# Patient Record
Sex: Female | Born: 1960 | Race: White | Hispanic: No | Marital: Married | Smoking: Never smoker
Health system: Southern US, Community
[De-identification: ages and names within clinical notes are randomized; demographics above are authoritative.]

## PROBLEM LIST (undated history)

## (undated) DIAGNOSIS — S82899A Other fracture of unspecified lower leg, initial encounter for closed fracture: Secondary | ICD-10-CM

## (undated) DIAGNOSIS — N3010 Interstitial cystitis (chronic) without hematuria: Secondary | ICD-10-CM

## (undated) DIAGNOSIS — N809 Endometriosis, unspecified: Secondary | ICD-10-CM

## (undated) DIAGNOSIS — F419 Anxiety disorder, unspecified: Secondary | ICD-10-CM

## (undated) DIAGNOSIS — F410 Panic disorder [episodic paroxysmal anxiety] without agoraphobia: Secondary | ICD-10-CM

## (undated) DIAGNOSIS — R87619 Unspecified abnormal cytological findings in specimens from cervix uteri: Secondary | ICD-10-CM

## (undated) HISTORY — DX: Unspecified abnormal cytological findings in specimens from cervix uteri: R87.619

## (undated) HISTORY — PX: OTHER SURGICAL HISTORY: SHX169

## (undated) HISTORY — DX: Interstitial cystitis (chronic) without hematuria: N30.10

## (undated) HISTORY — DX: Endometriosis, unspecified: N80.9

## (undated) HISTORY — DX: Panic disorder (episodic paroxysmal anxiety): F41.0

## (undated) HISTORY — DX: Other fracture of unspecified lower leg, initial encounter for closed fracture: S82.899A

## (undated) HISTORY — DX: Anxiety disorder, unspecified: F41.9

## (undated) HISTORY — PX: DIAGNOSTIC LAPAROSCOPY: SUR761

---

## 1981-07-03 DIAGNOSIS — N3010 Interstitial cystitis (chronic) without hematuria: Secondary | ICD-10-CM

## 1981-07-03 HISTORY — DX: Interstitial cystitis (chronic) without hematuria: N30.10

## 1999-12-01 ENCOUNTER — Other Ambulatory Visit: Admission: RE | Admit: 1999-12-01 | Discharge: 1999-12-01 | Payer: Self-pay | Admitting: Obstetrics and Gynecology

## 2000-12-27 ENCOUNTER — Other Ambulatory Visit: Admission: RE | Admit: 2000-12-27 | Discharge: 2000-12-27 | Payer: Self-pay | Admitting: Obstetrics and Gynecology

## 2002-04-02 ENCOUNTER — Other Ambulatory Visit: Admission: RE | Admit: 2002-04-02 | Discharge: 2002-04-02 | Payer: Self-pay | Admitting: Obstetrics and Gynecology

## 2002-07-03 LAB — HM DEXA SCAN

## 2003-05-19 ENCOUNTER — Other Ambulatory Visit: Admission: RE | Admit: 2003-05-19 | Discharge: 2003-05-19 | Payer: Self-pay | Admitting: Obstetrics and Gynecology

## 2004-12-19 ENCOUNTER — Other Ambulatory Visit: Admission: RE | Admit: 2004-12-19 | Discharge: 2004-12-19 | Payer: Self-pay | Admitting: Obstetrics and Gynecology

## 2006-02-07 ENCOUNTER — Other Ambulatory Visit: Admission: RE | Admit: 2006-02-07 | Discharge: 2006-02-07 | Payer: Self-pay | Admitting: Obstetrics and Gynecology

## 2007-04-08 ENCOUNTER — Other Ambulatory Visit: Admission: RE | Admit: 2007-04-08 | Discharge: 2007-04-08 | Payer: Self-pay | Admitting: Obstetrics and Gynecology

## 2008-06-04 ENCOUNTER — Other Ambulatory Visit: Admission: RE | Admit: 2008-06-04 | Discharge: 2008-06-04 | Payer: Self-pay | Admitting: Obstetrics & Gynecology

## 2008-06-24 ENCOUNTER — Inpatient Hospital Stay (HOSPITAL_COMMUNITY): Admission: EM | Admit: 2008-06-24 | Discharge: 2008-06-25 | Payer: Self-pay | Admitting: Emergency Medicine

## 2008-06-24 DIAGNOSIS — S82899A Other fracture of unspecified lower leg, initial encounter for closed fracture: Secondary | ICD-10-CM

## 2008-06-24 HISTORY — DX: Other fracture of unspecified lower leg, initial encounter for closed fracture: S82.899A

## 2010-11-15 NOTE — Op Note (Signed)
Janet Salinas, Janet Salinas                ACCOUNT NO.:  [id number]   MEDICAL RECORD NO.:  [id number]          PATIENT TYPE:  INP   LOCATION:  1530                         FACILITY:  [facility]   PHYSICIAN:  Nadara Mustard, MD     DATE OF BIRTH:  1961/03/26   DATE OF PROCEDURE:  06/24/2008  DATE OF DISCHARGE:                               OPERATIVE REPORT   PREOPERATIVE DIAGNOSIS:  Displaced Weber B left fibular fracture with  widening of the mortise.   POSTOPERATIVE DIAGNOSIS:  Displaced Weber B left fibular fracture with  widening of the mortise.   PROCEDURE:  1. Open reduction and internal fixation, left fibula.  2. Syndesmotic screw to stabilize the syndesmosis.   SURGEON:  Nadara Mustard, MD   ANESTHESIA:  General.   ESTIMATED BLOOD LOSS:  Minimal.   ANTIBIOTICS:  Kefzol 1 g.   DRAINS:  None.   COMPLICATIONS:  None.   TOURNIQUET TIME:  None.   DISPOSITION:  To PACU in stable condition.   INDICATIONS FOR PROCEDURE:  The patient is a 50 year old woman who  slipped on the ice today sustaining a displaced left ankle Weber B  fracture with fracture of the fibula and displacement of the  syndesmosis.  The patient presents at this time for open reduction and  internal fixation due to instability of the ankle.  Risks and benefits  were discussed with the patient including infection, neurovascular  injury, persistent pain, recurrent widening of the mortise, arthritis,  need for additional surgery.  The patient states he understands and  wished to proceed at this time.   PROCEDURE:  The patient was brought to OR and underwent a general  anesthetic.  After adequate level of anesthesia obtained, the patient's  left lower extremity was prepped using DuraPrep and draped into a  sterile field.  A lateral incision was made.  This was carried sharply  down to the fibula.  The fracture site was freshened.  The fracture was  reduced and then stabilized with an interfrag screw.  A  neutralization  locking one-third tubular plate was applied laterally with seven holes.  This was locked proximally with three locking screws and locked distally  with two locking screws.  The C-arm fluoroscopy was brought in.  There  was still widening of the mortise.  The mortise was then reduced and  this was then stabilized with a syndesmotic screw, 3.5 cortical screw 45  mm in length, 3 cortical screw.  C-arm fluoroscopy verified reduction on  both AP and lateral planes with restoration of the mortise.  The wound  was irrigated with normal saline.  Subcu was closed using 2-0 Vicryl.  The skin was closed using  approximated staples.  The wound was covered with Adaptic orthopedic  sponges, ABD, Kerlix, and Coban.  The patient was then extubated, taken  to PACU in stable condition.  Plan for 23-hour observation.  Discharge  in the morning.  Follow up in office in 2 weeks.      Nadara Mustard, MD  Electronically Signed     MVD/MEDQ  D:  06/24/2008  T:  06/25/2008  Job:  [id number]

## 2011-04-07 LAB — DIFFERENTIAL
Basophils Absolute: 0.4 10*3/uL — ABNORMAL HIGH (ref 0.0–0.1)
Basophils Relative: 3 % — ABNORMAL HIGH (ref 0–1)
Eosinophils Absolute: 0 10*3/uL (ref 0.0–0.7)
Eosinophils Relative: 0 % (ref 0–5)
Lymphocytes Relative: 6 % — ABNORMAL LOW (ref 12–46)
Lymphs Abs: 0.8 10*3/uL (ref 0.7–4.0)
Monocytes Absolute: 0.6 10*3/uL (ref 0.1–1.0)
Monocytes Relative: 4 % (ref 3–12)
Neutro Abs: 12.5 10*3/uL — ABNORMAL HIGH (ref 1.7–7.7)
Neutrophils Relative %: 88 % — ABNORMAL HIGH (ref 43–77)

## 2011-04-07 LAB — COMPREHENSIVE METABOLIC PANEL
ALT: 18 U/L (ref 0–35)
AST: 20 U/L (ref 0–37)
Albumin: 4.3 g/dL (ref 3.5–5.2)
Alkaline Phosphatase: 55 U/L (ref 39–117)
BUN: 10 mg/dL (ref 6–23)
CO2: 24 mEq/L (ref 19–32)
Calcium: 9.4 mg/dL (ref 8.4–10.5)
Chloride: 108 mEq/L (ref 96–112)
Creatinine, Ser: 0.73 mg/dL (ref 0.4–1.2)
GFR calc Af Amer: >60 mL/min (ref >60)
GFR calc non Af Amer: >60 mL/min (ref >60)
Glucose, Bld: 113 mg/dL — ABNORMAL HIGH (ref 70–99)
Potassium: 4.8 mEq/L (ref 3.5–5.1)
Sodium: 139 mEq/L (ref 135–145)
Total Bilirubin: 0.9 mg/dL (ref 0.3–1.2)
Total Protein: 7.1 g/dL (ref 6.0–8.3)

## 2011-04-07 LAB — CBC
HCT: 44.3 % (ref 36.0–46.0)
Hemoglobin: 15.1 g/dL — ABNORMAL HIGH (ref 12.0–15.0)
MCHC: 34.2 g/dL (ref 30.0–36.0)
MCV: 88.2 fL (ref 78.0–100.0)
Platelets: 196 10*3/uL (ref 150–400)
RBC: 5.02 MIL/uL (ref 3.87–5.11)
RDW: 11.9 % (ref 11.5–15.5)
WBC: 14.3 10*3/uL — ABNORMAL HIGH (ref 4.0–10.5)

## 2011-10-31 DIAGNOSIS — R87619 Unspecified abnormal cytological findings in specimens from cervix uteri: Secondary | ICD-10-CM

## 2011-10-31 HISTORY — DX: Unspecified abnormal cytological findings in specimens from cervix uteri: R87.619

## 2012-11-21 ENCOUNTER — Other Ambulatory Visit: Payer: Self-pay | Admitting: Nurse Practitioner

## 2012-11-22 NOTE — Telephone Encounter (Signed)
Please advise- Pt's last aex was 10/31/2011. LVM for pt to return my call to schedule aex. Ok to refill depo injection (104 mg)?  Chart in your rack.

## 2012-11-26 NOTE — Telephone Encounter (Signed)
OK to refill Depo Provera

## 2012-11-27 NOTE — Telephone Encounter (Signed)
RX x 1 sent to pharmacy.

## 2012-12-02 ENCOUNTER — Other Ambulatory Visit: Payer: Self-pay

## 2012-12-02 ENCOUNTER — Ambulatory Visit (INDEPENDENT_AMBULATORY_CARE_PROVIDER_SITE_OTHER): Payer: Self-pay | Admitting: *Deleted

## 2012-12-02 ENCOUNTER — Ambulatory Visit: Payer: Self-pay

## 2012-12-02 VITALS — BP 120/76 | Wt 162.0 lb

## 2012-12-02 DIAGNOSIS — Z3040 Encounter for surveillance of contraceptives, unspecified: Secondary | ICD-10-CM

## 2012-12-02 MED ORDER — MEDROXYPROGESTERONE ACETATE 104 MG/0.65ML ~~LOC~~ SUSP
104.0000 mg | Freq: Once | SUBCUTANEOUS | Status: AC
  Administered 2012-12-02: 104 mg via SUBCUTANEOUS

## 2012-12-02 NOTE — Progress Notes (Signed)
Date last pap: 10/31/11. Last Depo-Provera: 09/16/12. Side Effects if any: none. Serum HCG indicated? no. Depo-Provera 104 mg SQ given by: Francee Piccolo, CMA (AAMA). Next appointment due 8/18-9/1. Pt also advised she is due to AEX and no further injections can be given without AEX.  Pt voices understanding. Pt supplied medication

## 2013-02-06 ENCOUNTER — Other Ambulatory Visit: Payer: Self-pay | Admitting: Nurse Practitioner

## 2013-02-06 NOTE — Telephone Encounter (Signed)
eScribe request for refill on DEPO PROVERA 104 Next AEX - 02/17/13 and Depo injection One injection called to pharmacy.

## 2013-02-13 ENCOUNTER — Encounter: Payer: Self-pay | Admitting: *Deleted

## 2013-02-14 ENCOUNTER — Ambulatory Visit (INDEPENDENT_AMBULATORY_CARE_PROVIDER_SITE_OTHER): Payer: Self-pay | Admitting: Nurse Practitioner

## 2013-02-14 ENCOUNTER — Encounter: Payer: Self-pay | Admitting: Nurse Practitioner

## 2013-02-14 VITALS — BP 118/64 | HR 72 | Resp 12 | Ht 63.0 in | Wt 159.6 lb

## 2013-02-14 DIAGNOSIS — Z3040 Encounter for surveillance of contraceptives, unspecified: Secondary | ICD-10-CM

## 2013-02-14 DIAGNOSIS — Z01419 Encounter for gynecological examination (general) (routine) without abnormal findings: Secondary | ICD-10-CM

## 2013-02-14 DIAGNOSIS — Z1211 Encounter for screening for malignant neoplasm of colon: Secondary | ICD-10-CM

## 2013-02-14 DIAGNOSIS — Z0000 Encounter for general adult medical examination without abnormal findings: Secondary | ICD-10-CM

## 2013-02-14 LAB — HEMOGLOBIN, FINGERSTICK: Hemoglobin, fingerstick: 14.5 g/dL (ref 12.0–16.0)

## 2013-02-14 MED ORDER — MEDROXYPROGESTERONE ACETATE 104 MG/0.65ML ~~LOC~~ SUSP
104.0000 mg | Freq: Once | SUBCUTANEOUS | Status: AC
  Administered 2013-02-14: 104 mg via SUBCUTANEOUS

## 2013-02-14 MED ORDER — MEDROXYPROGESTERONE ACETATE 150 MG/ML IM SUSP: 150.0000 mg | Freq: Once | INTRAMUSCULAR | Status: DC

## 2013-02-14 NOTE — Patient Instructions (Signed)

## 2013-02-14 NOTE — Progress Notes (Signed)
52 y.o. G2P2 Married Caucasian Fe here for annual exam.  No new health problems except some recent mucous and lower pelvic pain with BM. At first she thought was related to time of next Depo and endometriosis pain. Does not seem to be related to change in diet.  No LMP recorded. Patient has had an injection.          Sexually active: yes  The current method of family planning is Depo-Provera injections.    Exercising: yes  weights  Smoker:  no  Health Maintenance: Pap:  10/31/2011  ASCUS with Negative HR HPV  MMG:  11/21/2011 will schedule Colonoscopy:  never BMD:   2004  -0.8/-0.6 TDaP:  Pt unsure  Labs: Hgb- 14.5    reports that she has never smoked. She has never used smokeless tobacco. She reports that she does not drink alcohol or use illicit drugs.  Past Medical History  Diagnosis Date  . Endometriosis   . Anxiety   . Panic attacks   . IC (interstitial cystitis)     Past Surgical History  Procedure Laterality Date  . Diagnostic laparoscopy    . Adenomyoses      ovaries adherdt to sidewall   . Vaginal delivery      x2    Current Outpatient Prescriptions  Medication Sig Dispense Refill  . Ascorbic Acid (VITAMIN C) 100 MG tablet Take 100 mg by mouth daily.      Marland Kitchen aspirin 81 MG tablet Take 81 mg by mouth daily.      . calcium carbonate (OS-CAL) 600 MG TABS tablet Take 600 mg by mouth 2 (two) times daily with a meal.      . DEPO-SUBQ PROVERA 104 104 MG/0.65ML injection INJECT SUBCUTANEOUS EVERY 3 MONTHS AS DIRECTED  0.65 mL  0  . fish oil-omega-3 fatty acids 1000 MG capsule Take 2 g by mouth daily.       No current facility-administered medications for this visit.    Family History  Problem Relation Age of Onset  . Hypertension Father   . Infertility Sister     ROS:  Pertinent items are noted in HPI.  Otherwise, a comprehensive ROS was negative.  Exam:   BP 118/64  Pulse 72  Resp 12  Ht 5\' 3"  (1.6 m)  Wt 159 lb 9.6 oz (72.394 kg)  BMI 28.28 kg/m2 Height: 5'  3" (160 cm)  Ht Readings from Last 3 Encounters:  02/14/13 5\' 3"  (1.6 m)    General appearance: alert, cooperative and appears stated age Head: Normocephalic, without obvious abnormality, atraumatic Neck: no adenopathy, supple, symmetrical, trachea midline and thyroid normal to inspection and palpation Lungs: clear to auscultation bilaterally Breasts: normal appearance, no masses or tenderness Heart: regular rate and rhythm Abdomen: soft, non-tender; no masses,  no organomegaly Extremities: extremities normal, atraumatic, no cyanosis or edema Skin: Skin color, texture, turgor normal. No rashes or lesions. Few hives on legs. Lymph nodes: Cervical, supraclavicular, and axillary nodes normal. No abnormal inguinal nodes palpated Neurologic: Grossly normal   Pelvic: External genitalia:  no lesions              Urethra:  normal appearing urethra with no masses, tenderness or lesions              Bartholin's and Skene's: normal                 Vagina: normal appearing vagina with normal color and discharge, no lesions  Cervix: anteverted              Pap taken: no Bimanual Exam:  Uterus:  normal size, contour, position, consistency, mobility, non-tender              Adnexa: no mass, fullness, tenderness               Rectovaginal: Confirms               Anus:  normal sphincter tone, no lesions  A:  Well Woman with normal exam  Depo Provera for contraception and severe endometriosis  Recent bowel changes - will get screening colonoscopy  History of idiopathic hives.  P:   Pap smear as per guidelines   Mammogram will schedule  Will check TSH and FSH today  Next Depo due today and will go ahead with injection - unsure if she  will need further injections - but do not want her to start with increase pelvic pain if she were to start having menses again.  Counseled on breast self exam, adequate intake of calcium and vitamin D, diet and exercise return annually or prn  An  After Visit Summary was printed and given to the patient.

## 2013-02-15 LAB — FOLLICLE STIMULATING HORMONE: FSH: 6.4 m[IU]/mL

## 2013-02-15 LAB — TSH: TSH: 2.357 u[IU]/mL (ref 0.350–4.500)

## 2013-02-17 ENCOUNTER — Telehealth: Payer: Self-pay | Admitting: *Deleted

## 2013-02-17 ENCOUNTER — Ambulatory Visit: Payer: Self-pay | Admitting: Nurse Practitioner

## 2013-02-17 NOTE — Progress Notes (Signed)
Encounter reviewed by Dr. Brook Silva.  

## 2013-02-17 NOTE — Telephone Encounter (Signed)
LVM for pt to return my call in regards to lab results.  

## 2013-02-17 NOTE — Telephone Encounter (Signed)
Message copied by Osie Bond on Mon Feb 17, 2013 11:21 AM ------      Message from: Ria Comment R      Created: Mon Feb 17, 2013  8:51 AM       Let patient know that she is not in menopause and does need to continue with Depo Provera.  Her TSH is also normal. We have sent referral for GI for her. ------

## 2013-02-21 ENCOUNTER — Telehealth: Payer: Self-pay | Admitting: Orthopedic Surgery

## 2013-02-21 NOTE — Telephone Encounter (Signed)
Spoke with pt's husband, as this is his cell number. He gave me pt's cell number [phone].

## 2013-02-21 NOTE — Telephone Encounter (Signed)
Call to home number. Mailbox is full and cannot leave message.

## 2013-02-21 NOTE — Telephone Encounter (Signed)
Call to pt's cell. Mailbox is full and cannot leave message.   (Need to tell her about appt with Dr. Loreta Ave 02-27-13 at 2:30. Phone [phone] to reschedule. Address [address])

## 2013-05-06 ENCOUNTER — Other Ambulatory Visit: Payer: Self-pay | Admitting: Nurse Practitioner

## 2013-05-14 ENCOUNTER — Encounter: Payer: Self-pay | Admitting: Nurse Practitioner

## 2013-05-21 ENCOUNTER — Ambulatory Visit: Payer: Self-pay

## 2013-05-21 ENCOUNTER — Ambulatory Visit (INDEPENDENT_AMBULATORY_CARE_PROVIDER_SITE_OTHER): Payer: Self-pay | Admitting: Obstetrics and Gynecology

## 2013-05-21 VITALS — BP 124/82 | HR 72 | Ht 63.0 in | Wt 161.0 lb

## 2013-05-21 DIAGNOSIS — Z3040 Encounter for surveillance of contraceptives, unspecified: Secondary | ICD-10-CM

## 2013-05-21 MED ORDER — MEDROXYPROGESTERONE ACETATE 104 MG/0.65ML ~~LOC~~ SUSP
104.0000 mg | Freq: Once | SUBCUTANEOUS | Status: AC
  Administered 2013-05-21: 104 mg via SUBCUTANEOUS

## 2013-05-21 NOTE — Progress Notes (Signed)
Patient ID: Janet Salinas, female   DOB: Jan 13, 1961, 52 y.o.   MRN: [id number] Pt arrived for Depo Provera injection.   Last AEX - 02/14/13 Last Depo Provera Given - 02/14/13 Pt is 5 days past due dates.  Pt's appt was scheduled past her due dates by our office.  Pt is very upset that we would schedule her appt wrong.  I apologized to the patient for our error. Pt adamantly refuses to do UPT stating that she knows she is not pregnant.  Pt states she has not had intercourse in at least one month.  Advised pt that I would have to consult with a physician before I could give her the injection. Pt is upset that she has had to wait once she got here, that we have made her wait to get her injection, and that someone different is giving her her injection when she has asked [place] for the same person.  Advised pt that Tiffany, who usually gives her Depo, is no longer here. Injection was given SQ in left anterior thigh without difficulty.  Pt is due to return for next injection between 08/06/13 and 08/20/13.  Reviewed these dates with the patient and requested she make sure when her appt is made today that it is between these two dates. I also advised the patient that the nursing supervisor will be meeting with the clerical supervisor to make to assure this does not happen again.  Pt feels better that steps will be taken to prevent this from happening again.

## 2013-05-21 NOTE — Progress Notes (Signed)
Encounter reviewed by Dr. Cyril Railey Silva.  

## 2013-06-25 ENCOUNTER — Encounter: Payer: Self-pay | Admitting: Nurse Practitioner

## 2013-07-26 ENCOUNTER — Other Ambulatory Visit: Payer: Self-pay | Admitting: Certified Nurse Midwife

## 2013-08-06 ENCOUNTER — Ambulatory Visit (INDEPENDENT_AMBULATORY_CARE_PROVIDER_SITE_OTHER): Payer: BC Managed Care – PPO | Admitting: *Deleted

## 2013-08-06 VITALS — BP 138/80 | HR 82 | Resp 16 | Wt 163.0 lb

## 2013-08-06 DIAGNOSIS — Z3040 Encounter for surveillance of contraceptives, unspecified: Secondary | ICD-10-CM

## 2013-08-06 MED ORDER — MEDROXYPROGESTERONE ACETATE 104 MG/0.65ML ~~LOC~~ SUSP
104.0000 mg | Freq: Once | SUBCUTANEOUS | Status: AC
  Administered 2013-08-06: 104 mg via SUBCUTANEOUS

## 2013-08-06 NOTE — Progress Notes (Signed)
Depo Provera 104mg  Sub Q given  Rt. Anterior thigh. Pt tolerated injection well. Next Depo Provera Due 4/22-11/05/13 cm

## 2013-10-17 ENCOUNTER — Other Ambulatory Visit: Payer: Self-pay | Admitting: Nurse Practitioner

## 2013-10-17 NOTE — Telephone Encounter (Signed)
Last AEX 02/14/13 Last refill 07/26/13 Next appt 03/17/14  Please approve or deny Rx.

## 2013-10-20 ENCOUNTER — Telehealth: Payer: Self-pay | Admitting: Nurse Practitioner

## 2013-10-20 NOTE — Telephone Encounter (Signed)
Confirmed with patient.

## 2013-10-20 NOTE — Telephone Encounter (Signed)
Calling patient back from phone message about appt

## 2013-10-23 ENCOUNTER — Ambulatory Visit (INDEPENDENT_AMBULATORY_CARE_PROVIDER_SITE_OTHER): Payer: BC Managed Care – PPO

## 2013-10-23 VITALS — BP 108/68 | HR 68 | Resp 16 | Ht 63.0 in | Wt 158.0 lb

## 2013-10-23 DIAGNOSIS — Z3040 Encounter for surveillance of contraceptives, unspecified: Secondary | ICD-10-CM

## 2013-10-23 MED ORDER — MEDROXYPROGESTERONE ACETATE 104 MG/0.65ML ~~LOC~~ SUSP
104.0000 mg | Freq: Once | SUBCUTANEOUS | Status: AC
  Administered 2013-10-23: 104 mg via SUBCUTANEOUS

## 2013-10-23 NOTE — Progress Notes (Signed)
Patient is here for depo 104 sub q. Last aex was 02-14-13. Last depo given was 08-06-13. Next depo due between 01-08-14 thru 01-22-14.pt tolerated injection well

## 2014-01-01 ENCOUNTER — Other Ambulatory Visit: Payer: Self-pay | Admitting: Certified Nurse Midwife

## 2014-01-05 NOTE — Telephone Encounter (Signed)
Last refilled: 10/19/13 Last AEX: 02/14/13  AEX scheduled for 03/17/14 with Ms. Patty Next Depo Scheduled for 01/08/14 @ 2:00  Okay to refill?

## 2014-01-08 ENCOUNTER — Ambulatory Visit (INDEPENDENT_AMBULATORY_CARE_PROVIDER_SITE_OTHER): Payer: BC Managed Care – PPO | Admitting: Obstetrics and Gynecology

## 2014-01-08 VITALS — BP 128/82 | HR 72 | Ht 63.0 in | Wt 162.0 lb

## 2014-01-08 DIAGNOSIS — Z3040 Encounter for surveillance of contraceptives, unspecified: Secondary | ICD-10-CM

## 2014-01-08 MED ORDER — MEDROXYPROGESTERONE ACETATE 104 MG/0.65ML ~~LOC~~ SUSP: 104.0000 mg | Freq: Once | SUBCUTANEOUS | Status: DC

## 2014-01-08 MED ORDER — MEDROXYPROGESTERONE ACETATE 104 MG/0.65ML ~~LOC~~ SUSP
104.0000 mg | Freq: Once | SUBCUTANEOUS | Status: AC
  Administered 2014-01-08: 104 mg via SUBCUTANEOUS

## 2014-01-08 NOTE — Progress Notes (Signed)
Encounter reviewed by Dr. Brook Silva.  

## 2014-01-08 NOTE — Progress Notes (Signed)
Janet FlavinJean M Salinas is a 53 y.o. female Who presents for SQ Depo Provera Injection.  ID and allergies confirmed. Denies complaints today.  Patient supplies her own injection today of DEPO-SUBQ PROVERA 104. 104 MG/0.65ML injection.  Injection given in mid L anterior thigh. Tolerated well and denies complaints after injection. Will return for next injection between 9/24 and 04/09/14.   Lot [id number] Expires 05/2016. Prefilled syringe. No NDC code on box.

## 2014-01-08 NOTE — Patient Instructions (Signed)
Return for next injection between 03/26/14 and 04/09/14.

## 2014-01-12 NOTE — Progress Notes (Signed)
I would not change anything about her Depo until her FSH is clearly in menopausal range.  Definitely check it in August.  Last one was 6.4.  Once clearly menopausal, then can stop.  I'm not sure she will feel any difference being off of it.  She will have the typical lack of estrogen symptoms--hot flashes, night sweats, etc.

## 2014-02-18 ENCOUNTER — Ambulatory Visit: Payer: Self-pay | Admitting: Nurse Practitioner

## 2014-02-20 ENCOUNTER — Ambulatory Visit: Payer: Self-pay | Admitting: Nurse Practitioner

## 2014-03-17 ENCOUNTER — Ambulatory Visit: Payer: Self-pay | Admitting: Nurse Practitioner

## 2014-03-21 ENCOUNTER — Other Ambulatory Visit: Payer: Self-pay | Admitting: Nurse Practitioner

## 2014-03-23 NOTE — Telephone Encounter (Signed)
OK to give RX and will refill this for a year at AEX.

## 2014-03-23 NOTE — Telephone Encounter (Signed)
Last AEX: 02/14/13 Last refill:01/05/14 0.65 mL X 0 Current AEX:04/28/14  Please advise

## 2014-03-24 NOTE — Telephone Encounter (Signed)
Refill sent.

## 2014-03-26 ENCOUNTER — Ambulatory Visit: Payer: BC Managed Care – PPO

## 2014-03-31 ENCOUNTER — Ambulatory Visit (INDEPENDENT_AMBULATORY_CARE_PROVIDER_SITE_OTHER): Payer: BC Managed Care – PPO

## 2014-03-31 VITALS — BP 128/82 | HR 72 | Ht 63.0 in | Wt 158.0 lb

## 2014-03-31 DIAGNOSIS — Z3040 Encounter for surveillance of contraceptives, unspecified: Secondary | ICD-10-CM

## 2014-03-31 MED ORDER — MEDROXYPROGESTERONE ACETATE 104 MG/0.65ML ~~LOC~~ SUSP
104.0000 mg | Freq: Once | SUBCUTANEOUS | Status: AC
  Administered 2014-03-31: 104 mg via SUBCUTANEOUS

## 2014-03-31 NOTE — Progress Notes (Signed)
Pt here for DEPO-SUBQ PROVERA 104. 104 MG/0.65ML injection. Last AEX 02/14/13. Last Depo given was 01/08/14. Next Depo due between 12/15-12/28. Next AEX is 04/28/14.  Per Kennon RoundsSally, ok to give injection but AEX will need to be done before next injection. Pt voiced understanding and agreed.

## 2014-03-31 NOTE — Progress Notes (Signed)
Wilma FlavinJean M Hertzberg is a 53 y.o. female  Who presents for SQ Depo Provera Injection.  ID and allergies confirmed. Denies complaints today.  Patient supplies her own injection today of DEPO-SUBQ PROVERA 104. 104 MG/0.65ML injection.  Injection given in mid R anterior thigh. Tolerated well and denies complaints after injection. Will return for next injection between 06/16/14-06/29/14.  Lot [id number]  Expires 08/2016. Prefilled syringe.  No NDC code on box.

## 2014-04-28 ENCOUNTER — Ambulatory Visit: Payer: Self-pay | Admitting: Nurse Practitioner

## 2014-06-01 ENCOUNTER — Other Ambulatory Visit: Payer: Self-pay | Admitting: Nurse Practitioner

## 2014-06-01 NOTE — Telephone Encounter (Signed)
Last refilled: 03/24/14 #0.65 ml/0 rfs AEX Scheduled: 06/16/14 with Ms. Patty  Last Mammogram: 02/27/14 Bi-Rads 0; Right Breast Ultrasound Benign  Patient was supposed to have AEX done before next Depo, patient is scheduled for 06/16/14 with Ms. Patty okay to refill until then?  Please Advise.

## 2014-06-16 ENCOUNTER — Ambulatory Visit: Payer: BC Managed Care – PPO

## 2014-06-16 ENCOUNTER — Ambulatory Visit (INDEPENDENT_AMBULATORY_CARE_PROVIDER_SITE_OTHER): Payer: BC Managed Care – PPO | Admitting: Nurse Practitioner

## 2014-06-16 ENCOUNTER — Encounter: Payer: Self-pay | Admitting: Nurse Practitioner

## 2014-06-16 VITALS — BP 116/80 | HR 76 | Ht 63.25 in | Wt 162.0 lb

## 2014-06-16 DIAGNOSIS — Z1211 Encounter for screening for malignant neoplasm of colon: Secondary | ICD-10-CM

## 2014-06-16 DIAGNOSIS — Z01419 Encounter for gynecological examination (general) (routine) without abnormal findings: Secondary | ICD-10-CM

## 2014-06-16 DIAGNOSIS — Z3040 Encounter for surveillance of contraceptives, unspecified: Secondary | ICD-10-CM

## 2014-06-16 DIAGNOSIS — Z0000 Encounter for general adult medical examination without abnormal findings: Secondary | ICD-10-CM

## 2014-06-16 DIAGNOSIS — R319 Hematuria, unspecified: Secondary | ICD-10-CM

## 2014-06-16 DIAGNOSIS — Z3042 Encounter for surveillance of injectable contraceptive: Secondary | ICD-10-CM

## 2014-06-16 LAB — HEMOGLOBIN, FINGERSTICK: Hemoglobin, fingerstick: 14.1 g/dL (ref 12.0–16.0)

## 2014-06-16 LAB — LIPID PANEL
Cholesterol: 178 mg/dL (ref 0–200)
HDL: 53 mg/dL (ref >39)
LDL Cholesterol: 106 mg/dL — ABNORMAL HIGH (ref 0–99)
Total CHOL/HDL Ratio: 3.4 Ratio
Triglycerides: 97 mg/dL (ref <150)
VLDL: 19 mg/dL (ref 0–40)

## 2014-06-16 LAB — POCT URINALYSIS DIPSTICK
Bilirubin, UA: NEGATIVE
Glucose, UA: NEGATIVE
Ketones, UA: NEGATIVE
Leukocytes, UA: NEGATIVE
Nitrite, UA: NEGATIVE
Urobilinogen, UA: NEGATIVE
pH, UA: 5

## 2014-06-16 LAB — COMPREHENSIVE METABOLIC PANEL
ALT: 17 U/L (ref 0–35)
AST: 16 U/L (ref 0–37)
Albumin: 4.3 g/dL (ref 3.5–5.2)
Alkaline Phosphatase: 57 U/L (ref 39–117)
BUN: 13 mg/dL (ref 6–23)
CO2: 26 mEq/L (ref 19–32)
Calcium: 9.2 mg/dL (ref 8.4–10.5)
Chloride: 105 mEq/L (ref 96–112)
Creat: 0.75 mg/dL (ref 0.50–1.10)
Glucose, Bld: 87 mg/dL (ref 70–99)
Potassium: 4.4 mEq/L (ref 3.5–5.3)
Sodium: 141 mEq/L (ref 135–145)
Total Bilirubin: 0.3 mg/dL (ref 0.2–1.2)
Total Protein: 6.6 g/dL (ref 6.0–8.3)

## 2014-06-16 MED ORDER — MEDROXYPROGESTERONE ACETATE 104 MG/0.65ML ~~LOC~~ SUSP: SUBCUTANEOUS | Status: DC

## 2014-06-16 MED ORDER — MEDROXYPROGESTERONE ACETATE 104 MG/0.65ML ~~LOC~~ SUSP
104.0000 mg | Freq: Once | SUBCUTANEOUS | Status: AC
  Administered 2014-06-16: 104 mg via SUBCUTANEOUS

## 2014-06-16 NOTE — Progress Notes (Signed)
Patient ID: Janet FlavinJean M Coull, female   DOB: 08/19/1960, 53 y.o.   MRN: [id number] 53 y.o. G2P2 Married Caucasian Fe here for annual exam.  Last time of spotting before Depo Injection was about 1.5 years ago.  Patient's last menstrual period was 06/16/2014.          Sexually active: yes  The current method of family planning is Depo-Provera injections.  Exercising: yes weights and cardio Smoker: no  Health Maintenance: Pap: 10/31/2011 (ASCUS with Negative HR HPV 10/31/2011)  MMG: 02/27/14, right cyst noted, yearly follow up Colonoscopy: never BMD: 2004 -0.8/-0.6 TDaP: Pt unsure  Labs:  HB:  14.1  Urine:  trace RBC and protein   reports that she has never smoked. She has never used smokeless tobacco. She reports that she does not drink alcohol or use illicit drugs.  Past Medical History  Diagnosis Date  . Endometriosis   . Anxiety   . Panic attacks   . IC (interstitial cystitis) 1983  . Abnormal Pap smear of cervix 10/30/13    ASCUS with negative HR HPV    Past Surgical History  Procedure Laterality Date  . Diagnostic laparoscopy  2001, 1992    endometriosis  . Adenomyoses      ovaries adherdt to sidewall   . Vaginal delivery      x2    Current Outpatient Prescriptions  Medication Sig Dispense Refill  . Ascorbic Acid (VITAMIN C) 100 MG tablet Take 100 mg by mouth daily.    Marland Kitchen. aspirin 81 MG tablet Take 81 mg by mouth daily.    . calcium carbonate (OS-CAL) 600 MG TABS tablet Take 600 mg by mouth 2 (two) times daily with a meal.    . Cholecalciferol (VITAMIN D PO) Take by mouth daily.    . fish oil-omega-3 fatty acids 1000 MG capsule Take 2 g by mouth daily.    . medroxyPROGESTERone (DEPO-SUBQ PROVERA 104) 104 MG/0.65ML injection INJECT INTO THE SKIN EVERY 3 MONTHS AS DIRECTED 0.65 mL 3  . Multiple Vitamins-Minerals (MULTIVITAMIN PO) Take by mouth daily.     No current facility-administered medications for this visit.    Family History  Problem Relation Age of  Onset  . Hypertension Father   . Cancer Father   . Infertility Sister   . Hypertension Mother   . Hypertension Brother   . Hypertension Sister     ROS:  Pertinent items are noted in HPI.  Otherwise, a comprehensive ROS was negative.  Exam:   BP 116/80 mmHg  Pulse 76  Ht 5' 3.25" (1.607 m)  Wt 162 lb (73.483 kg)  BMI 28.45 kg/m2  LMP 06/16/2014 Height: 5' 3.25" (160.7 cm)  Ht Readings from Last 3 Encounters:  06/16/14 5' 3.25" (1.607 m)  03/31/14 5\' 3"  (1.6 m)  01/08/14 5\' 3"  (1.6 m)    General appearance: alert, cooperative and appears stated age Head: Normocephalic, without obvious abnormality, atraumatic Neck: no adenopathy, supple, symmetrical, trachea midline and thyroid normal to inspection and palpation Lungs: clear to auscultation bilaterally Breasts: normal appearance, no masses or tenderness Heart: regular rate and rhythm Abdomen: soft, non-tender; no masses,  no organomegaly Extremities: extremities normal, atraumatic, no cyanosis or edema Skin: Skin color, texture, turgor normal. No rashes or lesions Lymph nodes: Cervical, supraclavicular, and axillary nodes normal. No abnormal inguinal nodes palpated Neurologic: Grossly normal   Pelvic: External genitalia:  no lesions              Urethra:  normal appearing urethra  with no masses, tenderness or lesions              Bartholin's and Skene's: normal                 Vagina: normal appearing vagina with normal color and discharge, no lesions              Cervix: anteverted              Pap taken: Yes.   Bimanual Exam:  Uterus:  normal size, contour, position, consistency, mobility, non-tender              Adnexa: no mass, fullness, tenderness               Rectovaginal: Confirms               Anus:  normal sphincter tone, no lesions  A:  Well Woman with normal exam  Depo Provera for contraception and severe endometriosis  Will get screening colonoscopy   P:   Reviewed health and  wellness pertinent to exam  Pap smear taken today  Mammogram is due 02/2015  Depo Provera today  Counseled on breast self exam, mammography screening, adequate intake of calcium and vitamin D, diet and exercise return annually or prn  An After Visit Summary was printed and given to the patient.

## 2014-06-16 NOTE — Patient Instructions (Signed)

## 2014-06-17 LAB — URINALYSIS, MICROSCOPIC ONLY
Bacteria, UA: NONE SEEN
Casts: NONE SEEN
Crystals: NONE SEEN
Squamous Epithelial / LPF: NONE SEEN

## 2014-06-17 LAB — TSH: TSH: 2.196 u[IU]/mL (ref 0.350–4.500)

## 2014-06-17 LAB — VITAMIN D 25 HYDROXY (VIT D DEFICIENCY, FRACTURES): Vit D, 25-Hydroxy: 46 ng/mL (ref 30–100)

## 2014-06-18 ENCOUNTER — Telehealth: Payer: Self-pay | Admitting: *Deleted

## 2014-06-18 LAB — URINE CULTURE: Colony Count: 60000

## 2014-06-18 NOTE — Telephone Encounter (Signed)
I have attempted to contact this patient by phone with the following results: left message to return call to [place] at [phone] on answering machine (mobile per [facility]).  No personal information given.  [phone] (Mobile)

## 2014-06-18 NOTE — Telephone Encounter (Signed)
-----   Message from Lauro FranklinPatricia Rolen-Grubb, FNP sent at 06/18/2014  8:53 AM EST ----- Let patient know that lipid panel shows a normal total cholesterol, triglycerides and HDL.  The LDL was slight higher that normal.  The TSH, kidney , liver, and glucose was normal.  Vit D was excellent.  Urine did not show RBC as we saw here..Marland Kitchen

## 2014-06-19 ENCOUNTER — Encounter: Payer: Self-pay | Admitting: Nurse Practitioner

## 2014-06-19 LAB — IPS PAP TEST WITH HPV

## 2014-06-19 NOTE — Telephone Encounter (Signed)
Pt notified in result note 06/18/14.  Closing encounter.

## 2014-06-21 NOTE — Progress Notes (Signed)
Encounter reviewed by Dr. Angelli Baruch Silva.  

## 2014-06-24 ENCOUNTER — Telehealth: Payer: Self-pay | Admitting: Nurse Practitioner

## 2014-06-24 NOTE — Telephone Encounter (Signed)
Pt returning call

## 2014-06-24 NOTE — Telephone Encounter (Signed)
Spoke with patient. Patient was seen on 12/15 for aex and given Depo Provera injection at that visit. Patient states "When I was in Patty told me it looked like I was having my period. This does usually happen when I am almost due to for next Depo. I will feel like my period is going to come. Since last week I have been having sharp pain on my left side that comes and goes. It feels like when I had a cyst before. I have been having pressure and cramping in my back and abdomen that comes and goes too. I had endometriosis before and this kind of feels the same." Denies any pain currently, bleeding, vomiting, nausea, fever, and chills. "I just feel a pressure and light cramping in my back and stomach." Has increased urinary frequency. Denies pain or burning with urination. No recent change in partners. Has been taking advil for relief. "I am worried about the cramping." Advised patient will need to be seen for evaluation today with provider. Offered patient appointment today at 2pm with Dr.Silva or 2:30pm with Verner Choleborah S. Leonard CNM. Patient declines both appointments. "I don't know what you are going to do for me." Advised patient with symptoms she is having do not want her to wait over holiday and symptoms worsen. Advised I strongly encourage her to be seen today for evaluation. Patient declines. " I need to think about it and call back." Advised patient if decides does not want to be seen in office if symptoms worsen over night, holiday, or weekend will need to be seen at urgent care or [facility]. Patient is agreeable and verbalizes understanding.  Routing to Dr.Silva for review as Lauro FranklinPatricia Rolen-Grubb, FNP is out of the office today. Anything further for this patient?

## 2014-06-24 NOTE — Telephone Encounter (Signed)
I would offer for the patient [facility] and then follow up in the office.  The ultrasound schedule for 07/02/14 for the office is already full.

## 2014-06-24 NOTE — Telephone Encounter (Signed)
Left message to call Charleston Hankin at [phone]. 

## 2014-06-24 NOTE — Telephone Encounter (Signed)
Patient calling requesting to speak with the nurse ([facility]) about problems with "cramping and pain" since her last visit. She said, "I think it may be from the depo provera." Please advise.

## 2014-06-24 NOTE — Telephone Encounter (Signed)
Left message to call Kaitlyn at [phone]. 

## 2014-06-29 NOTE — Telephone Encounter (Signed)
Left message to call Jola Critzer at [phone]. 

## 2014-07-08 NOTE — Telephone Encounter (Addendum)
Attempted to reach patient at number provided [phone]. There was no answer and recording states the mailbox is currently full and not accepting messages at this time.

## 2014-07-10 NOTE — Telephone Encounter (Signed)
Dr.Silva I have attempted to reach this patient x3 with no return call. How would you like me to proceed?

## 2014-07-11 NOTE — Telephone Encounter (Signed)
Nothing further needed.  Patient has been advised and appointment offered.   I have closed the encounter.

## 2014-08-28 ENCOUNTER — Telehealth: Payer: Self-pay | Admitting: Nurse Practitioner

## 2014-08-28 NOTE — Telephone Encounter (Signed)
Left message to call Arnett Galindez at [phone].  Patient's last depo was 06/16/14. Patient will need next depo March 2-16.

## 2014-08-28 NOTE — Telephone Encounter (Signed)
Pt had appointment 09/02/14 for depo. Pt cancelled due to scheduling conflicts. Pt would like to reschedule for day before.

## 2014-08-31 NOTE — Telephone Encounter (Addendum)
Attempted to reach patient at number provided [phone]. There was no answer and recording states that the voicemail box is currently full and not accepting messages at this time. Will try to reach patient again later.

## 2014-09-02 ENCOUNTER — Ambulatory Visit: Payer: BC Managed Care – PPO

## 2014-09-02 NOTE — Telephone Encounter (Signed)
Patient was rescheduled for depo for 09/03/14 at 2pm by the front.  Routing to provider for final review. Patient agreeable to disposition. Will close encounter

## 2014-09-03 ENCOUNTER — Ambulatory Visit: Payer: Self-pay

## 2014-09-03 ENCOUNTER — Telehealth: Payer: Self-pay

## 2014-09-03 NOTE — Telephone Encounter (Signed)
Spoke with patient at time of incoming call. Patient states [facility] this [facility] and they are stating she need PA to pick up the prescription. Advised patient that I will speak with pharmacy to see what is needed. If PA through insurance is needed advised can take 48-72 hours to hear back on approval or denial. Will clarify because depo appointment would need to be moved to get PA. Patient's last Depo was on 06/16/14. Will need next Depo March 2-16.

## 2014-09-03 NOTE — Telephone Encounter (Signed)
Spoke with patient. Advised patient appointment will need to be moved due to insurance needing PA for depo. Appointment moved to March 14th at 2pm. Patient is agreeable to date and time. Advised will initiate PA and will call her once we have heard back from the insurance company. Patient is agreeable and verbalizes understanding.

## 2014-09-04 NOTE — Telephone Encounter (Signed)
Spoke with Maralyn SagoSarah at [employer]. PA for depo provera initiated. Formed to be faxed to office.

## 2014-09-04 NOTE — Telephone Encounter (Signed)
Prior authorization to Lauro FranklinPatricia Rolen-Grubb, FNP for review and signature before fax.

## 2014-09-10 NOTE — Telephone Encounter (Signed)
Spoke with [employer] in regards to patient's prior authorization for Depo. PA was denied. Form to be faxed to office with denial information for Lauro FranklinPatricia Rolen-Grubb, FNP to review and advise.

## 2014-09-10 NOTE — Telephone Encounter (Signed)
Attempted to reach patient at [phone]. There was no answer and recording states that the voicemail box is not expecting messages at this time. Need to advised patient prior authorization for Depo was denied. Patient [facility]. This will allow is to see where she is in menopause as she may no longer need the depo injection.

## 2014-09-11 NOTE — Telephone Encounter (Signed)
Attempted to reach patient at [phone]. There was no answer and recording states that the voicemail box is not accepting message at this time. Will try again later.

## 2014-09-11 NOTE — Telephone Encounter (Signed)
Patient returning call.

## 2014-09-11 NOTE — Telephone Encounter (Signed)
Attempted to reach patient at number provided [phone]. There was no answer and recording states that the voicemail box is not accepting messages at this time. Will try again later.

## 2014-09-11 NOTE — Telephone Encounter (Signed)
Attempted to reach patient at [phone]. There was no answer and recording states that the voicemail box is not excepting messages at this time. Will try again later.

## 2014-09-14 ENCOUNTER — Ambulatory Visit (INDEPENDENT_AMBULATORY_CARE_PROVIDER_SITE_OTHER): Payer: BLUE CROSS/BLUE SHIELD | Admitting: *Deleted

## 2014-09-14 VITALS — BP 116/82 | HR 72 | Ht 63.25 in | Wt 168.0 lb

## 2014-09-14 DIAGNOSIS — Z3040 Encounter for surveillance of contraceptives, unspecified: Secondary | ICD-10-CM | POA: Diagnosis not present

## 2014-09-14 MED ORDER — MEDROXYPROGESTERONE ACETATE 104 MG/0.65ML ~~LOC~~ SUSP
104.0000 mg | Freq: Once | SUBCUTANEOUS | Status: AC
  Administered 2014-09-14: 104 mg via SUBCUTANEOUS

## 2014-09-14 NOTE — Telephone Encounter (Signed)
Spoke with patient. Advised patient of prior authorization for Depo was denied. Advised spoke with Dr.Silva and Lauro FranklinPatricia Rolen-Grubb, FNP who recommend that patient come in [facility]. Advised this will allow us to see where she is as far as menopause and if we need to proceed with Depo. Patient declines this lab appointment at this time. "I know I am not in menopause. I am already having pain with it being close to the end of my shot. I will have that testing at my annual if I need it. I know right now I am not in menopause. I will pay out of pocket for the prescription. It probably will not be that much of a cost difference." Patient is scheduled for nurse appointment today at 2pm. Advised will let Dr.Silva know. Patient is agreeable.  Dr.Silva, okay for patient to proceed with appointment today at 2pm?

## 2014-09-14 NOTE — Progress Notes (Signed)
Patient ID: Janet FlavinJean M Mazurowski, female   DOB: [birth year], 54 y.o.   MRN: [id number] Pt arrived for Depo Provera injection.  Pt tolerated injection well in right anterior thigh. Last AEX - 06/16/14 Last Depo Provera Given - 06/16/14 Pt is within due dates. Pt should return between 11/30/14 and 12/14/14.

## 2014-09-14 NOTE — Telephone Encounter (Signed)
Ok to proceed with Depo Provera injection as planned.

## 2014-12-01 ENCOUNTER — Ambulatory Visit (INDEPENDENT_AMBULATORY_CARE_PROVIDER_SITE_OTHER): Payer: BLUE CROSS/BLUE SHIELD | Admitting: *Deleted

## 2014-12-01 VITALS — BP 124/84 | HR 64 | Ht 63.25 in | Wt 172.0 lb

## 2014-12-01 DIAGNOSIS — Z3040 Encounter for surveillance of contraceptives, unspecified: Secondary | ICD-10-CM

## 2014-12-01 MED ORDER — MEDROXYPROGESTERONE ACETATE 104 MG/0.65ML ~~LOC~~ SUSP
104.0000 mg | Freq: Once | SUBCUTANEOUS | Status: AC
  Administered 2014-12-01: 104 mg via SUBCUTANEOUS

## 2014-12-01 NOTE — Progress Notes (Signed)
Patient ID: Janet Salinas, female   DOB: [birth year], 54 y.o.   MRN: [id number]   Pt arrived for Depo Provera injection.  Pt tolerated injection well in right anterior thigh. Last AEX - 06/16/14 Last Depo Provera Given - 09/14/14 Pt is within due dates. Pt should return between 02/16/15 and 03/02/15. Pt supplied medication.

## 2015-02-16 ENCOUNTER — Ambulatory Visit (INDEPENDENT_AMBULATORY_CARE_PROVIDER_SITE_OTHER): Payer: BLUE CROSS/BLUE SHIELD | Admitting: *Deleted

## 2015-02-16 ENCOUNTER — Encounter: Payer: Self-pay | Admitting: *Deleted

## 2015-02-16 VITALS — BP 124/82 | HR 64 | Ht 63.25 in | Wt 167.0 lb

## 2015-02-16 DIAGNOSIS — Z3040 Encounter for surveillance of contraceptives, unspecified: Secondary | ICD-10-CM

## 2015-02-16 MED ORDER — MEDROXYPROGESTERONE ACETATE 104 MG/0.65ML ~~LOC~~ SUSP
104.0000 mg | Freq: Once | SUBCUTANEOUS | Status: AC
  Administered 2015-02-16: 104 mg via SUBCUTANEOUS

## 2015-02-16 NOTE — Patient Instructions (Signed)
Please return between 05/13/15-05/18/15 for next injection.

## 2015-02-16 NOTE — Progress Notes (Signed)
Patient ID: Janet Salinas, female   DOB: 21-Nov-1960, 54 y.o.   MRN: [id number] Pt arrived for Depo Provera injection.  Pt tolerated injection well in right anterior thigh. Last AEX - 06/16/14 Last Depo Provera Given - 11/30/13 Pt is within due dates. Pt should return between 05/04/15 and 05/18/15.

## 2015-04-29 ENCOUNTER — Other Ambulatory Visit: Payer: Self-pay | Admitting: Nurse Practitioner

## 2015-04-29 NOTE — Telephone Encounter (Signed)
Medication refill request: Depo  Last AEX:  06/16/14  Next AEX: 05/04/15  Last MMG (if hormonal medication request): BIRADS2:Benign  Refill authorized: please advise.

## 2015-04-29 NOTE — Telephone Encounter (Addendum)
Patient is getting Depo Provera 05/04/2015, patient is needing refill of Depo for that visit. Best contact [phone]

## 2015-05-03 NOTE — Telephone Encounter (Signed)
Patient calling to check on refill for depo.

## 2015-05-03 NOTE — Telephone Encounter (Signed)
Patient is calling again asking for status of depo refill. Patient says her pharmacy has to order the depo. Patient says she called for refill request last Thursday to insure she would get the refill in time. I told patient the CMA is still waiting to hear from Janet Chuebbie Salinas CNM regarding the refill. She asked if another provider could approve this request due to her appointment being tomorrow. Patient said Janet Salinas, CNM is not her normal provider.

## 2015-05-03 NOTE — Telephone Encounter (Signed)
I will approve one Depo injection.  Patient will need to do her mammogram and schedule an annual exam for any more Depo shots.  Thanks.

## 2015-05-03 NOTE — Telephone Encounter (Signed)
Medication refill request: Depo Provera 104 Last AEX:  06/16/14 Next AEX: does not have one scheduled at this time-coming in for Depo 05/04/15, can schedule then Last MMG (if hormonal medication request): 02/24/14 MMG, 02/27/14 right breast diag/us-BiRads 2-Benign Refill authorized: please advise

## 2015-05-04 ENCOUNTER — Ambulatory Visit: Payer: BLUE CROSS/BLUE SHIELD

## 2015-05-04 ENCOUNTER — Ambulatory Visit (INDEPENDENT_AMBULATORY_CARE_PROVIDER_SITE_OTHER): Payer: BLUE CROSS/BLUE SHIELD | Admitting: *Deleted

## 2015-05-04 VITALS — BP 138/80 | HR 84 | Resp 14 | Wt 168.0 lb

## 2015-05-04 DIAGNOSIS — Z3009 Encounter for other general counseling and advice on contraception: Secondary | ICD-10-CM

## 2015-05-04 MED ORDER — MEDROXYPROGESTERONE ACETATE 104 MG/0.65ML ~~LOC~~ SUSY
104.0000 mg | PREFILLED_SYRINGE | Freq: Once | SUBCUTANEOUS | Status: AC
  Administered 2015-05-04: 104 mg via SUBCUTANEOUS

## 2015-05-04 NOTE — Progress Notes (Signed)
Patient ID: Janet FlavinJean M Salinas, female   DOB: [birth year], 54 y.o.   MRN: [id number] Pt arrived for Depo Provera injection. Pt tolerated injection well in right anterior thigh. Last AEX - 06/16/14 Last Depo Provera Given - 02-16-15 Pt is within due dates. Pt should return between 07-20-15 and 08-03-15.

## 2015-05-06 NOTE — Telephone Encounter (Signed)
Patient notified. Agreed to schedule her AEX when she comes in for her Depo injection, but would like to wait until 1/17. Also, would like to speak with PG regarding MMG, does not wish to do this yearly. Aware of Dr Rica RecordsSilva's recommendations and that she will not be able to get next depo without this. States Dr Edward JollySilva is not her normal doctor and would like to speak with PG about this. Aware I will inform PG and they will discuss at her AEX.//kn

## 2015-07-14 ENCOUNTER — Other Ambulatory Visit: Payer: Self-pay | Admitting: Obstetrics and Gynecology

## 2015-07-14 NOTE — Telephone Encounter (Signed)
Medication refill request: Depo- Provera Injection Last AEX:  05-04-15 Next AEX: 07-21-15 Last MMG (if hormonal medication request): 02-27-14 - Breast U/S- WNL Refill authorized: please advise

## 2015-07-16 NOTE — Telephone Encounter (Signed)
Must get Mammo before next refill

## 2015-07-21 ENCOUNTER — Encounter: Payer: Self-pay | Admitting: Nurse Practitioner

## 2015-07-21 ENCOUNTER — Ambulatory Visit (INDEPENDENT_AMBULATORY_CARE_PROVIDER_SITE_OTHER): Payer: BLUE CROSS/BLUE SHIELD | Admitting: Nurse Practitioner

## 2015-07-21 VITALS — BP 124/82 | HR 84 | Resp 18 | Ht 63.5 in | Wt 154.0 lb

## 2015-07-21 DIAGNOSIS — Z01419 Encounter for gynecological examination (general) (routine) without abnormal findings: Secondary | ICD-10-CM

## 2015-07-21 DIAGNOSIS — Z23 Encounter for immunization: Secondary | ICD-10-CM | POA: Diagnosis not present

## 2015-07-21 DIAGNOSIS — Z0000 Encounter for general adult medical examination without abnormal findings: Secondary | ICD-10-CM | POA: Diagnosis not present

## 2015-07-21 DIAGNOSIS — Z3009 Encounter for other general counseling and advice on contraception: Secondary | ICD-10-CM

## 2015-07-21 LAB — POCT URINALYSIS DIPSTICK
Bilirubin, UA: NEGATIVE
Blood, UA: NEGATIVE
Glucose, UA: NEGATIVE
Ketones, UA: NEGATIVE
Leukocytes, UA: NEGATIVE
Nitrite, UA: NEGATIVE
Protein, UA: NEGATIVE
Urobilinogen, UA: NEGATIVE
pH, UA: 5

## 2015-07-21 LAB — HEMOGLOBIN, FINGERSTICK: Hemoglobin, fingerstick: 14.8 g/dL (ref 12.0–16.0)

## 2015-07-21 MED ORDER — MEDROXYPROGESTERONE ACETATE 104 MG/0.65ML ~~LOC~~ SUSY
104.0000 mg | PREFILLED_SYRINGE | Freq: Once | SUBCUTANEOUS | Status: AC
  Administered 2015-07-21: 104 mg via SUBCUTANEOUS

## 2015-07-21 NOTE — Patient Instructions (Signed)

## 2015-07-21 NOTE — Progress Notes (Signed)
55 y.o. G2P2 Married  Caucasian Fe here for annual exam.  No vaginal spotting since 06/16/14.  No pelvic pain.  At times feels like menses is going to start before next injection is due.  No Vaso symptoms.  Patient's last menstrual period was 06/16/2014.          Sexually active: Yes.    The current method of family planning is Depo-Provera injections.    Exercising: Yes.    Weights, walking Smoker:  no  Health Maintenance: Pap:06/16/14, Negative with neg HR HPV  MMG:07/17/15, report requested  Colonoscopy: 01/20/15, Diverticulosis, repeat in 10 years (Dr. Loreta Ave)  BMD: 2004 -0.8/-0.6  TDaP: Pt unsure   Shingles: Not indicated due to age  Pneumovax: Not indicated due to age  Hep C and HIV: done today  Labs: HGB 14.8 Urine: normal   reports that she has never smoked. She has never used smokeless tobacco. She reports that she does not drink alcohol or use illicit drugs.  Past Medical History  Diagnosis Date  . Endometriosis   . Anxiety   . Panic attacks   . IC (interstitial cystitis) 1983  . Abnormal Pap smear of cervix 10/30/13    ASCUS with negative HR HPV    Past Surgical History  Procedure Laterality Date  . Diagnostic laparoscopy  2001, 1992    endometriosis  . Adenomyoses      ovaries adherdt to sidewall   . Vaginal delivery      x2    Current Outpatient Prescriptions  Medication Sig Dispense Refill  . Ascorbic Acid (VITAMIN C) 100 MG tablet Take 100 mg by mouth daily.    Marland Kitchen aspirin 81 MG tablet Take 81 mg by mouth daily.    . calcium carbonate (OS-CAL) 600 MG TABS tablet Take 600 mg by mouth 2 (two) times daily with a meal.    . Cholecalciferol (VITAMIN D PO) Take by mouth daily.    Marland Kitchen DEPO-SUBQ PROVERA 104 104 MG/0.65ML injection INJECT INTO THE SKIN EVERY 3 MONTHS AS DIRECTED 0.65 mL 0  . fish oil-omega-3 fatty acids 1000 MG capsule Take 2 g by mouth daily.    . Multiple Vitamins-Minerals (MULTIVITAMIN PO) Take by mouth daily.     No current  facility-administered medications for this visit.    Family History  Problem Relation Age of Onset  . Hypertension Father   . Cancer Father   . Infertility Sister   . Hypertension Mother   . Hypertension Brother   . Hypertension Sister     ROS:  Pertinent items are noted in HPI.  Otherwise, a comprehensive ROS was negative.  Exam:   LMP 06/16/2014   Ht Readings from Last 3 Encounters:  02/16/15 5' 3.25" (1.607 m)  12/01/14 5' 3.25" (1.607 m)  09/14/14 5' 3.25" (1.607 m)    General appearance: alert, cooperative and appears stated age Head: Normocephalic, without obvious abnormality, atraumatic Neck: no adenopathy, supple, symmetrical, trachea midline and thyroid normal to inspection and palpation Lungs: clear to auscultation bilaterally Breasts: normal appearance, no masses or tenderness Heart: regular rate and rhythm Abdomen: soft, non-tender; no masses,  no organomegaly Extremities: extremities normal, atraumatic, no cyanosis or edema Skin: Skin color, texture, turgor normal. No rashes or lesions Lymph nodes: Cervical, supraclavicular, and axillary nodes normal. No abnormal inguinal nodes palpated Neurologic: Grossly normal   Pelvic: External genitalia:  no lesions              Urethra:  normal appearing urethra with no  masses, tenderness or lesions              Bartholin's and Skene's: normal                 Vagina: normal appearing vagina with normal color and discharge, no lesions              Cervix: anteverted              Pap taken: No. Bimanual Exam:  Uterus:  normal size, contour, position, consistency, mobility, non-tender              Adnexa: no mass, fullness, tenderness               Rectovaginal: Confirms               Anus:  normal sphincter tone, no lesions  Chaperone present: no  A:  Well Woman with normal exam  Depo Provera for contraception and severe endometriosis    P:   Reviewed health and wellness pertinent to  exam  Pap smear as above  Mammogram is due1/2018  Will follow with labs  She is for Depo injection today, refill for a year - will consult with Dr. Edward Jolly about when we can stop the injections.  Counseled on breast self exam, mammography screening, adequate intake of calcium and vitamin D, diet and exercise return annually or prn  An After Visit Summary was printed and given to the patient.

## 2015-07-22 ENCOUNTER — Ambulatory Visit: Payer: BLUE CROSS/BLUE SHIELD

## 2015-07-22 LAB — HIV ANTIBODY (ROUTINE TESTING W REFLEX): HIV 1&2 Ab, 4th Generation: NONREACTIVE

## 2015-07-22 LAB — HEPATITIS C ANTIBODY: HCV Ab: NEGATIVE

## 2015-07-25 NOTE — Progress Notes (Signed)
Encounter reviewed by Dr. Janean Sark. I recommend discontinuation of the Depo Provera.  Up to Date supports the discontinuation of Depo Provera for women at age 55 yo. FSH levels will be suppressed while on Depo Provera and are not reliable for determining menopausal status.  At that, repeat levels will tell this information most reliably.

## 2015-07-26 ENCOUNTER — Telehealth: Payer: Self-pay | Admitting: Nurse Practitioner

## 2015-07-26 DIAGNOSIS — N912 Amenorrhea, unspecified: Secondary | ICD-10-CM

## 2015-07-26 NOTE — Telephone Encounter (Signed)
Patient is called with information about Depo Provera.  Per Dr. Edward Jolly she is able to stop Depo Provera after this injection which is what we had looked up in Up To Date information while she was here.  She is still very worried about recurrent pelvic pain secondary to endometriosis.  We have decided that we will do an [facility] at the time of her next Depo shot apt. - nurse visit changed to lab visit.  Then if [facility] is menopausal as it should be then she is comfortable with stopping the Depo Provera at that time.  Will follow with labs.

## 2015-10-06 ENCOUNTER — Other Ambulatory Visit (INDEPENDENT_AMBULATORY_CARE_PROVIDER_SITE_OTHER): Payer: BLUE CROSS/BLUE SHIELD

## 2015-10-06 DIAGNOSIS — N912 Amenorrhea, unspecified: Secondary | ICD-10-CM

## 2015-10-06 LAB — FOLLICLE STIMULATING HORMONE: FSH: 66.8 m[IU]/mL

## 2015-10-11 ENCOUNTER — Telehealth: Payer: Self-pay

## 2015-10-11 NOTE — Telephone Encounter (Signed)
-----   Message from Ria CommentPatricia Grubb, FNP sent at 10/08/2015 10:09 AM EDT ----- Please let pt know [facility].  Per our last conversation with pt and Dr. Edward JollySilva we feel she is OK to stop Depo Provera at this time.  Very unlikely that she would get another menses or spotting.  But if she does to call.

## 2015-10-11 NOTE — Telephone Encounter (Signed)
Left message to call Caedan Sumler at [phone]. 

## 2015-10-12 NOTE — Telephone Encounter (Signed)
Spoke with patient. Advised of message as seen below from [name], FNP. She is agreeable and verbalizes understanding.  Routing to provider for final review. Patient agreeable to disposition. Will close encounter.  

## 2015-10-15 ENCOUNTER — Other Ambulatory Visit: Payer: Self-pay | Admitting: Nurse Practitioner

## 2015-10-18 NOTE — Telephone Encounter (Signed)
Medication refill request: Depo Provera 104 mg/0.65 ml  Last AEX:  07/21/15 Next AEX: No AEX scheduled for 2018 Last MMG (if hormonal medication request): 07/17/15 bi-rads 2: benign Refill authorized: Please advise

## 2016-10-31 ENCOUNTER — Encounter: Payer: Self-pay | Admitting: Nurse Practitioner

## 2016-10-31 ENCOUNTER — Ambulatory Visit (INDEPENDENT_AMBULATORY_CARE_PROVIDER_SITE_OTHER): Payer: BLUE CROSS/BLUE SHIELD | Admitting: Nurse Practitioner

## 2016-10-31 VITALS — BP 118/70 | HR 72 | Resp 16 | Ht 63.0 in | Wt 138.0 lb

## 2016-10-31 DIAGNOSIS — N809 Endometriosis, unspecified: Secondary | ICD-10-CM

## 2016-10-31 DIAGNOSIS — Z0000 Encounter for general adult medical examination without abnormal findings: Secondary | ICD-10-CM | POA: Diagnosis not present

## 2016-10-31 DIAGNOSIS — Z01419 Encounter for gynecological examination (general) (routine) without abnormal findings: Secondary | ICD-10-CM | POA: Diagnosis not present

## 2016-10-31 NOTE — Progress Notes (Signed)
56 y.o. G2P2 Married  Caucasian Fe here for annual exam.  After Depo Provera injection given in January 2017 - she then had FSH done on 10/06/2015.  The result at 73.8 showed she was menopausal and therefore less likely to have another menses.  Since then no bleeding.  Weight loss since off Depo in addition has increased her exercise.  Mother died last 2023-05-03 from HTN crisis.  Patient's last menstrual period was 06/16/2014.          Sexually active: Yes.    The current method of family planning is post menopausal status.    Exercising: Yes.    walking, running, weights Smoker:  no  Health Maintenance: Pap: 06/16/14, Negative with neg HR HPV  10/31/11, ASCUS with neg HR HPV History of Abnormal Pap: no, ASCUS with neg HR HPV in 2013 MMG: 07/17/15, 3D-yes, Density Category C, Bi-Rads 2:  Benign Self Breast exams: occasionally Colonoscopy: 01/20/15, Diverticulosis, repeat in 10 years (Dr. Loreta Ave) BMD: 2004 T Score: -0.8 Spine / -0.6 Right Femur Neck TDaP: 07/21/15 Hep C and HIV: 07/21/15 Labs: discuss today    reports that she has never smoked. She has never used smokeless tobacco. She reports that she does not drink alcohol or use drugs.  Past Medical History:  Diagnosis Date  . Abnormal Pap smear of cervix 10/31/2011   ASCUS with negative HR HPV  . Anxiety   . Endometriosis 1992 & 2001   exploratory lap  . Fracture, ankle 06/24/2008   left ankle  . IC (interstitial cystitis) 1983  . Panic attacks     Past Surgical History:  Procedure Laterality Date  . adenomyoses     ovaries adherdt to sidewall   . DIAGNOSTIC LAPAROSCOPY  2001, 1992   endometriosis  . VAGINAL DELIVERY     x2    Current Outpatient Prescriptions  Medication Sig Dispense Refill  . Ascorbic Acid (VITAMIN C) 100 MG tablet Take 100 mg by mouth daily.    Marland Kitchen aspirin 81 MG tablet Take 81 mg by mouth daily.    . calcium carbonate (OS-CAL) 600 MG TABS tablet Take 600 mg by mouth 2 (two) times daily with a meal.    .  Cholecalciferol (VITAMIN D PO) Take by mouth daily.    . fish oil-omega-3 fatty acids 1000 MG capsule Take 2 g by mouth daily.    . Multiple Vitamins-Minerals (MULTIVITAMIN PO) Take by mouth daily.    . Nutritional Supplements (JUICE PLUS FIBRE PO) Take by mouth daily.     No current facility-administered medications for this visit.     Family History  Problem Relation Age of Onset  . Hypertension Father   . Cancer Father 75    melanoma  . Hypertension Mother   . Hypertension Brother   . Infertility Sister   . Hypertension Sister   . Diabetes Sister   . Hypertension Brother   . Diabetes Sister   . Diabetes Sister   . Hypertension Sister     ROS:  Pertinent items are noted in HPI.  Otherwise, a comprehensive ROS was negative.  Exam:   BP 118/70 (BP Location: Right Arm, Patient Position: Sitting, Cuff Size: Normal)   Pulse 72   Resp 16   Ht  (1.6 m)   Wt 138 lb (62.6 kg)   LMP 06/16/2014   BMI 24.45 kg/m  Height:  (160 cm) Ht Readings from Last 3 Encounters:  10/31/16  (1.6 m)  07/21/15 5' 3.5" (  1.613 m)  02/16/15 5' 3.25" (1.607 m)    General appearance: alert, cooperative and appears stated age Head: Normocephalic, without obvious abnormality, atraumatic Neck: no adenopathy, supple, symmetrical, trachea midline and thyroid normal to inspection and palpation Lungs: clear to auscultation bilaterally Breasts: normal appearance, no masses or tenderness Heart: regular rate and rhythm Abdomen: soft, non-tender; no masses,  no organomegaly Extremities: extremities normal, atraumatic, no cyanosis or edema Skin: Skin color, texture, turgor normal. No rashes or lesions Lymph nodes: Cervical, supraclavicular, and axillary nodes normal. No abnormal inguinal nodes palpated Neurologic: Grossly normal   Pelvic: External genitalia:  no lesions              Urethra:  normal appearing urethra with no masses, tenderness or lesions              Bartholin's and  Skene's: normal                 Vagina: normal appearing vagina with normal color and discharge, no lesions              Cervix: anteverted              Pap taken: Yes.   Bimanual Exam:  Uterus:  normal size, contour, position, consistency, mobility, non-tender              Adnexa: no mass, fullness, tenderness               Rectovaginal: Confirms               Anus:  normal sphincter tone, no lesions  Chaperone present: yes  A:  Well Woman with normal exam  Depo Provera for contraception and severe endometriosis until 07/2015  Now menopausal and no bleeding  Tolerable vaso symptoms.  Weight loss since on Depo   P:   Reviewed health and wellness pertinent to exam  Pap smear: yes  Mammogram is due and will schedule  Will return for fasting labs  Counseled on breast self exam, mammography screening, adequate intake of calcium and vitamin D, diet and exercise, Kegel's exercises  return annually or prn  An After Visit Summary was printed and given to the patient.

## 2016-10-31 NOTE — Patient Instructions (Signed)

## 2016-11-01 ENCOUNTER — Other Ambulatory Visit: Payer: BLUE CROSS/BLUE SHIELD

## 2016-11-01 DIAGNOSIS — Z0000 Encounter for general adult medical examination without abnormal findings: Secondary | ICD-10-CM

## 2016-11-01 LAB — TSH: TSH: 1.55 mIU/L

## 2016-11-01 LAB — CBC
HCT: 41.7 % (ref 35.0–45.0)
Hemoglobin: 13.8 g/dL (ref 11.7–15.5)
MCH: 29 pg (ref 27.0–33.0)
MCHC: 33.1 g/dL (ref 32.0–36.0)
MCV: 87.6 fL (ref 80.0–100.0)
MPV: 10.3 fL (ref 7.5–12.5)
Platelets: 202 10*3/uL (ref 140–400)
RBC: 4.76 MIL/uL (ref 3.80–5.10)
RDW: 13.7 % (ref 11.0–15.0)
WBC: 5.4 10*3/uL (ref 3.8–10.8)

## 2016-11-01 LAB — COMPREHENSIVE METABOLIC PANEL
ALT: 46 U/L — ABNORMAL HIGH (ref 6–29)
AST: 27 U/L (ref 10–35)
Albumin: 4.2 g/dL (ref 3.6–5.1)
Alkaline Phosphatase: 59 U/L (ref 33–130)
BUN: 15 mg/dL (ref 7–25)
CO2: 26 mmol/L (ref 20–31)
Calcium: 9.3 mg/dL (ref 8.6–10.4)
Chloride: 106 mmol/L (ref 98–110)
Creat: 0.71 mg/dL (ref 0.50–1.05)
Glucose, Bld: 89 mg/dL (ref 65–99)
Potassium: 4.9 mmol/L (ref 3.5–5.3)
Sodium: 140 mmol/L (ref 135–146)
Total Bilirubin: 0.7 mg/dL (ref 0.2–1.2)
Total Protein: 6.7 g/dL (ref 6.1–8.1)

## 2016-11-01 LAB — LIPID PANEL
Cholesterol: 198 mg/dL (ref <200)
HDL: 69 mg/dL (ref >50)
LDL Cholesterol: 120 mg/dL — ABNORMAL HIGH (ref <100)
Total CHOL/HDL Ratio: 2.9 Ratio (ref <5.0)
Triglycerides: 44 mg/dL (ref <150)
VLDL: 9 mg/dL (ref <30)

## 2016-11-01 LAB — IPS PAP TEST WITH HPV

## 2016-11-01 NOTE — Progress Notes (Signed)
Encounter reviewed by Dr. Leotha Westermeyer Amundson C. Silva.  

## 2016-11-02 LAB — VITAMIN D 25 HYDROXY (VIT D DEFICIENCY, FRACTURES): Vit D, 25-Hydroxy: 33 ng/mL (ref 30–100)

## 2016-12-08 ENCOUNTER — Telehealth: Payer: Self-pay | Admitting: Nurse Practitioner

## 2016-12-08 DIAGNOSIS — R740 Nonspecific elevation of levels of transaminase and lactic acid dehydrogenase [LDH]: Principal | ICD-10-CM

## 2016-12-08 DIAGNOSIS — R7401 Elevation of levels of liver transaminase levels: Secondary | ICD-10-CM

## 2016-12-08 NOTE — Telephone Encounter (Signed)
Patient calling back to schedule follow-up from previous abnormal lab test.  Patient states she does not drink alcohol and has only had motrin twice since we advised her of lab results approximately 5 weeks ago.  Appointment scheduled for 11-12-16.    Routing to provider for final review. Patient agreeable to disposition. Will close encounter.    Routing to Pepsi[employer]Deborah Leonard CNM, in Patty's absence.

## 2016-12-08 NOTE — Telephone Encounter (Signed)
Patient called to schedule follow up lab appointment but she is not sure if it's just labs or she need to see a provider.

## 2016-12-13 ENCOUNTER — Ambulatory Visit: Payer: BLUE CROSS/BLUE SHIELD

## 2016-12-13 DIAGNOSIS — R7401 Elevation of levels of liver transaminase levels: Secondary | ICD-10-CM

## 2016-12-13 DIAGNOSIS — R740 Nonspecific elevation of levels of transaminase and lactic acid dehydrogenase [LDH]: Principal | ICD-10-CM

## 2016-12-14 LAB — HEPATIC FUNCTION PANEL
ALT: 52 IU/L — ABNORMAL HIGH (ref 0–32)
AST: 31 IU/L (ref 0–40)
Albumin: 4.5 g/dL (ref 3.5–5.5)
Alkaline Phosphatase: 71 IU/L (ref 39–117)
Bilirubin Total: 0.5 mg/dL (ref 0.0–1.2)
Bilirubin, Direct: 0.14 mg/dL (ref 0.00–0.40)
Total Protein: 6.8 g/dL (ref 6.0–8.5)

## 2016-12-18 ENCOUNTER — Other Ambulatory Visit: Payer: Self-pay | Admitting: *Deleted

## 2016-12-18 ENCOUNTER — Telehealth: Payer: Self-pay | Admitting: Nurse Practitioner

## 2016-12-18 DIAGNOSIS — R740 Nonspecific elevation of levels of transaminase and lactic acid dehydrogenase [LDH]: Principal | ICD-10-CM

## 2016-12-18 DIAGNOSIS — R7401 Elevation of levels of liver transaminase levels: Secondary | ICD-10-CM

## 2016-12-18 NOTE — Telephone Encounter (Signed)
Patient called requesting to speak with Noreene LarssonJill to schedule an appointment. She said, "It's something to to with my liver."

## 2016-12-18 NOTE — Telephone Encounter (Signed)
See lab result note dated 12/18/16, will close encounter.

## 2017-01-30 ENCOUNTER — Telehealth: Payer: Self-pay | Admitting: *Deleted

## 2017-01-30 NOTE — Telephone Encounter (Signed)
Patient will call back to reschedule. Mail letter °

## 2017-07-27 ENCOUNTER — Emergency Department (HOSPITAL_COMMUNITY): Payer: BLUE CROSS/BLUE SHIELD

## 2017-07-27 ENCOUNTER — Emergency Department (HOSPITAL_COMMUNITY)
Admission: EM | Admit: 2017-07-27 | Discharge: 2017-07-27 | Disposition: A | Discharge status: Home / Self Care | Payer: BLUE CROSS/BLUE SHIELD | Attending: Emergency Medicine | Admitting: Emergency Medicine

## 2017-07-27 ENCOUNTER — Other Ambulatory Visit: Payer: Self-pay

## 2017-07-27 ENCOUNTER — Encounter (HOSPITAL_COMMUNITY): Payer: Self-pay

## 2017-07-27 DIAGNOSIS — Y929 Unspecified place or not applicable: Secondary | ICD-10-CM | POA: Diagnosis not present

## 2017-07-27 DIAGNOSIS — Z79899 Other long term (current) drug therapy: Secondary | ICD-10-CM | POA: Diagnosis not present

## 2017-07-27 DIAGNOSIS — S59902A Unspecified injury of left elbow, initial encounter: Secondary | ICD-10-CM | POA: Diagnosis present

## 2017-07-27 DIAGNOSIS — W0110XA Fall on same level from slipping, tripping and stumbling with subsequent striking against unspecified object, initial encounter: Secondary | ICD-10-CM | POA: Diagnosis not present

## 2017-07-27 DIAGNOSIS — Y999 Unspecified external cause status: Secondary | ICD-10-CM | POA: Insufficient documentation

## 2017-07-27 DIAGNOSIS — R52 Pain, unspecified: Secondary | ICD-10-CM

## 2017-07-27 DIAGNOSIS — Z7982 Long term (current) use of aspirin: Secondary | ICD-10-CM | POA: Insufficient documentation

## 2017-07-27 DIAGNOSIS — S42202A Unspecified fracture of upper end of left humerus, initial encounter for closed fracture: Secondary | ICD-10-CM

## 2017-07-27 DIAGNOSIS — Y9301 Activity, walking, marching and hiking: Secondary | ICD-10-CM | POA: Insufficient documentation

## 2017-07-27 MED ORDER — OXYCODONE-ACETAMINOPHEN 5-325 MG PO TABS: 1.0000 | ORAL_TABLET | ORAL | 0 refills | Status: AC | PRN

## 2017-07-27 MED ORDER — ONDANSETRON 4 MG PO TBDP
4.0000 mg | ORAL_TABLET | Freq: Once | ORAL | Status: AC
  Administered 2017-07-27: 4 mg via ORAL
  Filled 2017-07-27: qty 1

## 2017-07-27 MED ORDER — ONDANSETRON HCL 4 MG PO TABS: 4.0000 mg | ORAL_TABLET | Freq: Three times a day (TID) | ORAL | 0 refills | Status: AC | PRN

## 2017-07-27 MED ORDER — OXYCODONE-ACETAMINOPHEN 5-325 MG PO TABS
1.0000 | ORAL_TABLET | Freq: Once | ORAL | Status: AC
  Administered 2017-07-27: 1 via ORAL
  Filled 2017-07-27: qty 1

## 2017-07-27 MED ORDER — SENNOSIDES-DOCUSATE SODIUM 8.6-50 MG PO TABS: 1.0000 | ORAL_TABLET | Freq: Every day | ORAL | 0 refills | Status: AC

## 2017-07-27 MED ORDER — ONDANSETRON HCL 4 MG/2ML IJ SOLN: 4.0000 mg | Freq: Once | INTRAMUSCULAR | Status: DC

## 2017-07-27 MED ORDER — HYDROMORPHONE HCL 1 MG/ML IJ SOLN: 0.5000 mg | Freq: Once | INTRAMUSCULAR | Status: DC

## 2017-07-27 NOTE — ED Provider Notes (Signed)
[facility] Provider Note   CSN: [id number] Arrival date & time: 07/27/17  0705     History   Chief Complaint Chief Complaint  Patient presents with  . Arm Pain   HPI   Blood pressure (!) 94/55, pulse 60, temperature (!) 97.5 F (36.4 C), temperature source Oral, resp. rate 16, height 5\' 3"  (1.6 m), weight 59 kg (130 lb), last menstrual period 06/16/2014, SpO2 100 %.  Zandra AbtsMelinda J Vallejo is a 57 y.o. female complaining of who is right-hand dominant, had a mechanical fall this morning she slipped on ice while walking the dogs.  She fell backwards onto her buttocks, there was no head trauma, she is not anticoagulated.  She did not fall on outstretched hand and she has no wrist pain.  She has been ambulatory since the event.  She denies any numbness or weakness she reports severe pain in the left shoulder.  No cervicalgia.  No pain medication taken prior to arrival.  She does not regularly follow with an orthopedist, she saw one approximately 5 years ago but cannot remember their name.  Past Medical History:  Diagnosis Date  . Abnormal Pap smear of cervix 10/31/2011   ASCUS with negative HR HPV  . Anxiety   . Endometriosis 1992 & 2001   exploratory lap  . Fracture, ankle 06/24/2008   left ankle  . IC (interstitial cystitis) 1983  . Panic attacks     There are no active problems to display for this patient.   Past Surgical History:  Procedure Laterality Date  . adenomyoses     ovaries adherdt to sidewall   . DIAGNOSTIC LAPAROSCOPY  2001, 1992   endometriosis  . VAGINAL DELIVERY     x2    OB History    Gravida Para Term Preterm AB Living   2 2 2  0 0 2   SAB TAB Ectopic Multiple Live Births   0 0 0 0 2       Home Medications    Prior to Admission medications   Medication Sig Start Date End Date Taking? Authorizing Provider  Ascorbic Acid (VITAMIN C) 100 MG tablet Take 100 mg by mouth daily.    [provider]    aspirin 81 MG tablet Take 81 mg by mouth daily.    [provider]  calcium carbonate (OS-CAL) 600 MG TABS tablet Take 600 mg by mouth 2 (two) times daily with a meal.    [provider]  Cholecalciferol (VITAMIN D PO) Take by mouth daily.    [provider]  fish oil-omega-3 fatty acids 1000 MG capsule Take 2 g by mouth daily.    [provider]  Multiple Vitamins-Minerals (MULTIVITAMIN PO) Take by mouth daily.    [provider]  Nutritional Supplements (JUICE PLUS FIBRE PO) Take by mouth daily.    [provider]  ondansetron (ZOFRAN) 4 MG tablet Take 1 tablet (4 mg total) by mouth every 8 (eight) hours as needed for nausea or vomiting. 07/27/17   Annemarie Sebree, Joni ReiningNicole, PA-C  oxyCODONE-acetaminophen (PERCOCET) 5-325 MG tablet Take 1 tablet by mouth every 4 (four) hours as needed. 07/27/17   Grove Defina, Joni ReiningNicole, PA-C  senna-docusate (SENOKOT-S) 8.6-50 MG tablet Take 1 tablet by mouth at bedtime. 07/27/17   Yazeed Pryer, Joni ReiningNicole, PA-C    Family History Family History  Problem Relation Age of Onset  . Hypertension Father   . Cancer Father [id number]       melanoma  .  Hypertension Mother   . Hypertension Brother   . Infertility Sister   . Hypertension Sister   . Diabetes Sister   . Hypertension Brother   . Diabetes Sister   . Diabetes Sister   . Hypertension Sister     Social History Social History   Tobacco Use  . Smoking status: Never Smoker  . Smokeless tobacco: Never Used  Substance Use Topics  . Alcohol use: No  . Drug use: No     Allergies   Codeine; Dairy aid [lactase]; and Dust mite extract   Review of Systems Review of Systems  A complete review of systems was obtained and all systems are negative except as noted in the HPI and PMH.   Physical Exam Updated Vital Signs BP (!) 94/55 (BP Location: Right Arm)   Pulse 60   Temp (!) 97.5 F (36.4 C) (Oral)   Resp 16   Ht 5\' 3"  (1.6 m)   Wt 59 kg (130 lb)   LMP 06/16/2014    SpO2 100%   BMI 23.03 kg/m   Physical Exam  Constitutional: She is oriented to person, place, and time. She appears well-developed and well-nourished. No distress.  HENT:  Head: Normocephalic and atraumatic.  Mouth/Throat: Oropharynx is clear and moist.  No abrasions or contusions.   No hemotympanum, battle signs or raccoon's eyes  No crepitance or tenderness to palpation along the orbital rim.  EOMI intact with no pain or diplopia  No abnormal otorrhea or rhinorrhea. Nasal septum midline.  No intraoral trauma.  Eyes: Conjunctivae and EOM are normal. Pupils are equal, round, and reactive to light.  Neck: Normal range of motion. Neck supple.  No midline C-spine  tenderness to palpation or step-offs appreciated. Patient has full range of motion without pain.  Grip/bicep/tricep strength 5/5 bilaterally. Able to differentiate between pinprick and light touch bilaterally     Cardiovascular: Normal rate, regular rhythm and intact distal pulses.  Pulmonary/Chest: Effort normal and breath sounds normal. No respiratory distress. She has no wheezes. She has no rales. She exhibits no tenderness.  No TTP or crepitance  Abdominal: Soft. Bowel sounds are normal. She exhibits no distension and no mass. There is no tenderness. There is no rebound and no guarding.  Musculoskeletal: She exhibits tenderness. She exhibits no edema.  No range of motion to left shoulder, focal bony tenderness along the head of the humerus.  This is a closed injury.  No tenderness to palpation along the olecranon, full range of motion to elbow, no snuffbox tenderness bilaterally.  Distally neurovascularly intact with radial pulse 2+ and grip strength 5 out of 5 bilaterally.  Pelvis stable, No TTP of greater trochanter bilaterally  No tenderness to percussion of Lumbar/Thoracic spinous processes. No step-offs. No paraspinal muscular TTP  Neurological: She is alert and oriented to person, place, and time.  Strength  5/5 x4 extremities   Distal sensation intact  Skin: Skin is warm. She is not diaphoretic.  Psychiatric: She has a normal mood and affect.  Nursing note and vitals reviewed.    ED Treatments / Results  Labs (all labs ordered are listed, but only abnormal results are displayed) Labs Reviewed - No data to display  EKG  EKG Interpretation None       Radiology Dg Elbow 2 Views Left  Result Date: 07/27/2017 CLINICAL DATA:  Acute left elbow pain following fall today. Initial encounter. EXAM: LEFT ELBOW - 2 VIEW COMPARISON:  None. FINDINGS: There is no evidence of fracture,  dislocation, or joint effusion. There is no evidence of arthropathy or other focal bone abnormality. Soft tissues are unremarkable. IMPRESSION: Negative. Electronically Signed   By: Harmon Pier M.D.   On: 07/27/2017 08:10   Dg Shoulder Left  Result Date: 07/27/2017 CLINICAL DATA:  Acute left shoulder pain following fall today. Initial encounter. EXAM: LEFT SHOULDER - 2+ VIEW COMPARISON:  None. FINDINGS: A fracture of the left humeral neck is noted with fracture line extending into the head does not appear to involve the articular surface. 4 mm medial displacement noted. There is no evidence of humeral head dislocation. IMPRESSION: Left humeral neck fracture with mild medial displacement. Electronically Signed   By: Harmon Pier M.D.   On: 07/27/2017 08:09    Procedures Procedures (including critical care time)  Medications Ordered in ED Medications  oxyCODONE-acetaminophen (PERCOCET/ROXICET) 5-325 MG per tablet 1 tablet (1 tablet Oral Given 07/27/17 1019)  ondansetron (ZOFRAN-ODT) disintegrating tablet 4 mg (4 mg Oral Given 07/27/17 1018)     Initial Impression / Assessment and Plan / ED Course  I have reviewed the triage vital signs and the nursing notes.  Pertinent labs & imaging results that were available during my care of the patient were reviewed by me and considered in my medical decision making (see chart  for details).     Vitals:   07/27/17 0713 07/27/17 0717  BP: (!) 94/55   Pulse: 60   Resp: 16   Temp: (!) 97.5 F (36.4 C)   TempSrc: Oral   SpO2: 100%   Weight:  59 kg (130 lb)  Height:  5\' 3"  (1.6 m)    Medications  oxyCODONE-acetaminophen (PERCOCET/ROXICET) 5-325 MG per tablet 1 tablet (1 tablet Oral Given 07/27/17 1019)  ondansetron (ZOFRAN-ODT) disintegrating tablet 4 mg (4 mg Oral Given 07/27/17 1018)    CORALEIGH SHEERAN is 57 y.o. female presenting with proximal humerus fracture, closed.  Patient given sling, she declines IV pain medication, will give Percocet, orthopedic referral, patient neurovascularly intact.  Extensive discussion of return precautions with patient and her husband who verbalized understanding and teach back technique.  Evaluation does not show pathology that would require ongoing emergent intervention or inpatient treatment. Pt is hemodynamically stable and mentating appropriately. Discussed findings and plan with patient/guardian, who agrees with care plan. All questions answered. Return precautions discussed and outpatient follow up given.    Final Clinical Impressions(s) / ED Diagnoses   Final diagnoses:  Closed fracture of proximal end of left humerus, unspecified fracture morphology, initial encounter    ED Discharge Orders        Ordered    oxyCODONE-acetaminophen (PERCOCET) 5-325 MG tablet  Every 4 hours PRN     07/27/17 0937    ondansetron (ZOFRAN) 4 MG tablet  Every 8 hours PRN     07/27/17 0937    senna-docusate (SENOKOT-S) 8.6-50 MG tablet  Daily at bedtime     07/27/17 0937       Jashaun Penrose, Mardella Layman 07/27/17 1052    Bethann Berkshire, MD 07/27/17 [phone]

## 2017-07-27 NOTE — Discharge Instructions (Signed)
Take percocet for breakthrough pain, do not drink alcohol, drive, care for children or do other critical tasks while taking percocet. ° °Please follow with your primary care doctor in the next 2 days for a check-up. They must obtain records for further management.  ° °Do not hesitate to return to the Emergency Department for any new, worsening or concerning symptoms.  ° °

## 2017-07-27 NOTE — ED Notes (Signed)
PT refuses IV meds and prefers to take oral medications. EDPA made aware.

## 2017-07-27 NOTE — ED Triage Notes (Signed)
PT states she slipped on ice this morning when walking dog landing on left elbow. PT endorses pain to left shoulder and left elbow. She states she felt "pop" in shoulder.

## 2017-11-02 ENCOUNTER — Ambulatory Visit: Payer: BLUE CROSS/BLUE SHIELD | Admitting: Nurse Practitioner

## 2018-04-16 ENCOUNTER — Emergency Department (HOSPITAL_COMMUNITY)
Admission: EM | Admit: 2018-04-16 | Discharge: 2018-04-16 | Disposition: A | Discharge status: Home / Self Care | Payer: BLUE CROSS/BLUE SHIELD | Attending: Emergency Medicine | Admitting: Emergency Medicine

## 2018-04-16 ENCOUNTER — Emergency Department (HOSPITAL_COMMUNITY): Payer: BLUE CROSS/BLUE SHIELD

## 2018-04-16 ENCOUNTER — Encounter (HOSPITAL_COMMUNITY): Payer: Self-pay

## 2018-04-16 ENCOUNTER — Other Ambulatory Visit: Payer: Self-pay

## 2018-04-16 DIAGNOSIS — M79631 Pain in right forearm: Secondary | ICD-10-CM | POA: Insufficient documentation

## 2018-04-16 DIAGNOSIS — W010XXA Fall on same level from slipping, tripping and stumbling without subsequent striking against object, initial encounter: Secondary | ICD-10-CM | POA: Diagnosis not present

## 2018-04-16 DIAGNOSIS — Z79899 Other long term (current) drug therapy: Secondary | ICD-10-CM | POA: Insufficient documentation

## 2018-04-16 DIAGNOSIS — Y998 Other external cause status: Secondary | ICD-10-CM | POA: Insufficient documentation

## 2018-04-16 DIAGNOSIS — Y9301 Activity, walking, marching and hiking: Secondary | ICD-10-CM | POA: Diagnosis not present

## 2018-04-16 DIAGNOSIS — Y929 Unspecified place or not applicable: Secondary | ICD-10-CM | POA: Insufficient documentation

## 2018-04-16 DIAGNOSIS — Z7982 Long term (current) use of aspirin: Secondary | ICD-10-CM | POA: Insufficient documentation

## 2018-04-16 DIAGNOSIS — M25561 Pain in right knee: Secondary | ICD-10-CM | POA: Insufficient documentation

## 2018-04-16 DIAGNOSIS — W19XXXA Unspecified fall, initial encounter: Secondary | ICD-10-CM

## 2018-04-16 MED ORDER — IBUPROFEN 200 MG PO TABS
400.0000 mg | ORAL_TABLET | Freq: Once | ORAL | Status: AC
  Administered 2018-04-16: 400 mg via ORAL
  Filled 2018-04-16: qty 2

## 2018-04-16 NOTE — ED Notes (Signed)
Patient transported to X-ray 
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
## 2018-04-16 NOTE — ED Notes (Signed)
Pt ambulatory to restroom at this time without difficulty.

## 2018-04-16 NOTE — ED Triage Notes (Addendum)
Pt arrives via [facility] from home. Pt had mechanical fall while walking dog. Pt reports right knee pain. Pt denies LOC or head injury. Pt has chipped teeth from fall. Pts only complaint of pain is to right knee. Denies head, neck, back pain

## 2018-04-16 NOTE — Discharge Instructions (Signed)
All your imaging was negative today there no fractures or dislocations on your right knee or right forearm. You may continue to take ibuprofen for your pain.Please keep knee elevated and apply Ice to the area. If you experience worsening symptoms you may return to the ED for reevaluation.

## 2018-04-16 NOTE — ED Notes (Signed)
Bed: [facility] Expected date:  Expected time:  Means of arrival:  Comments: 

## 2018-11-02 IMAGING — CR DG FOREARM 2V*R*
2 series · 2 of 2 positions shown · non-contrast
Comparison: None.

CLINICAL DATA: Fall

EXAM:
RIGHT FOREARM - 2 VIEW

[x forearm ap right]
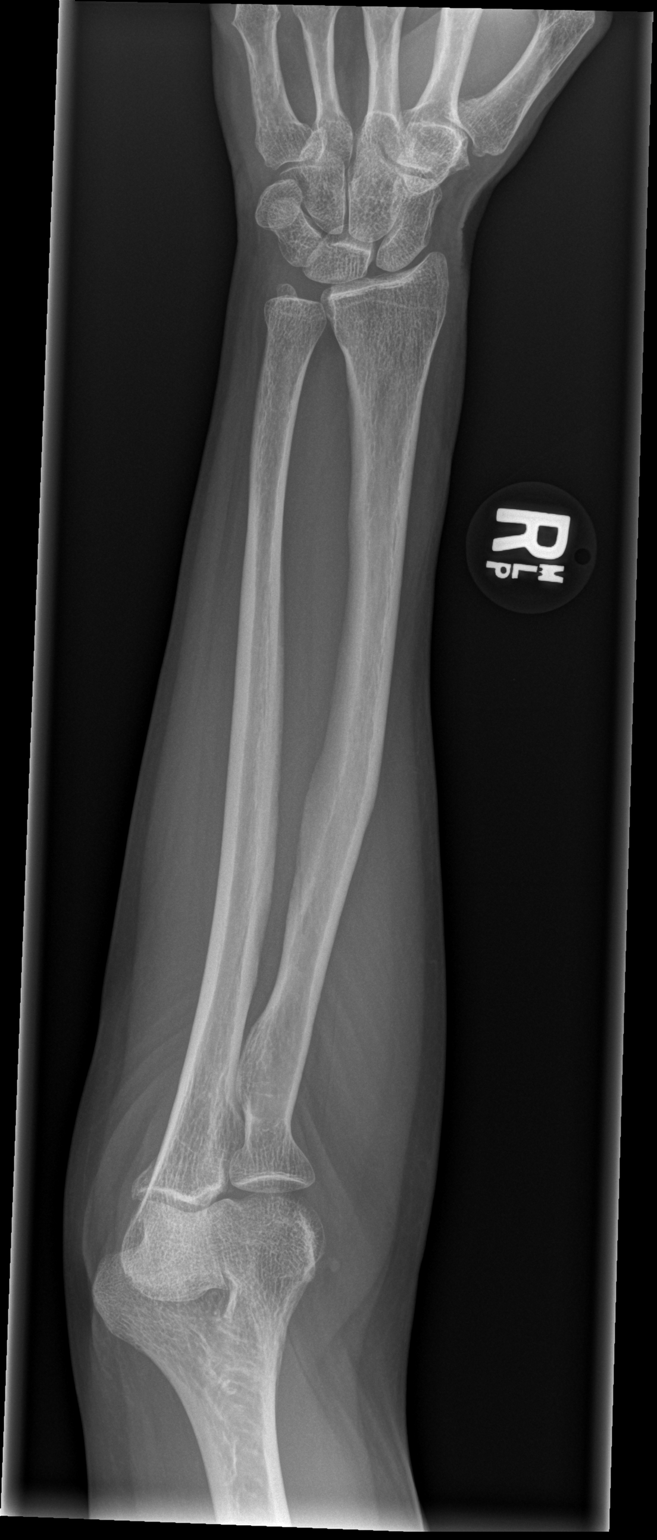

[x forearm lat right]
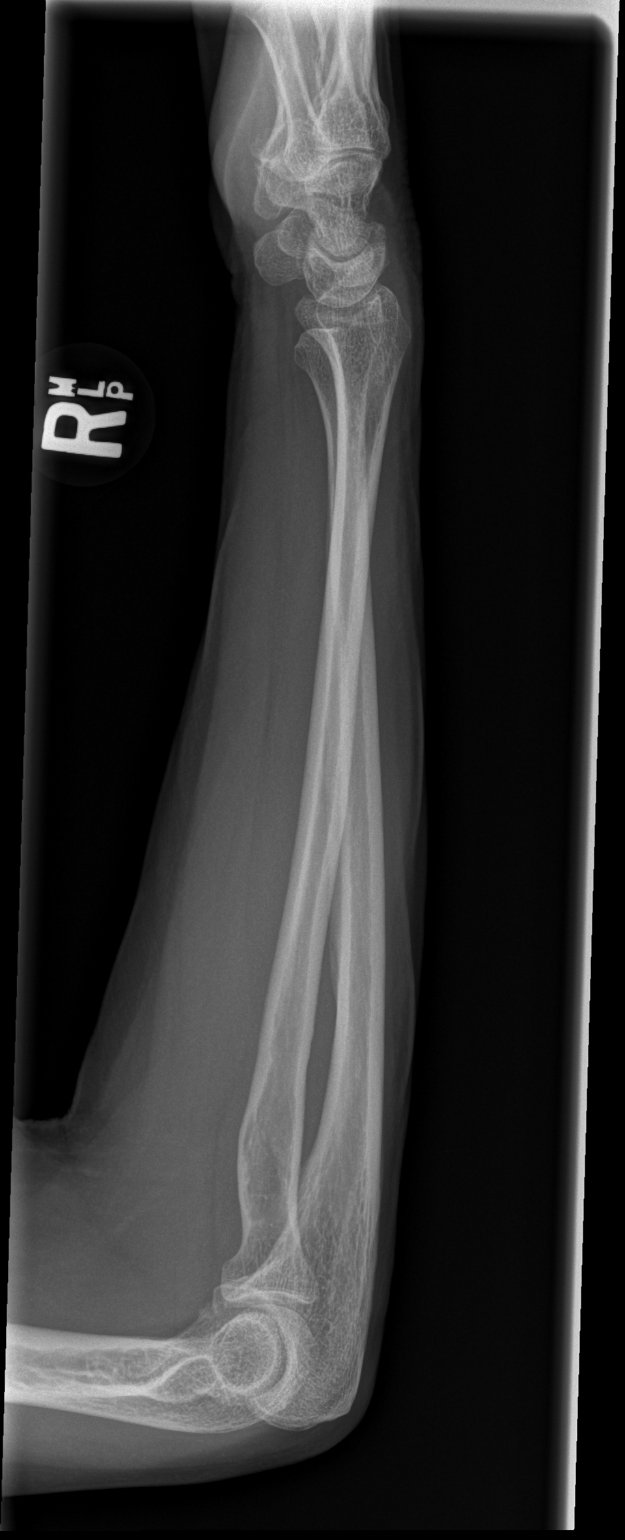

[2 of 2 positions shown; findings below may reference images not displayed]

FINDINGS: There is no evidence of fracture or other focal bone lesions. Soft
tissues are unremarkable.
IMPRESSION: Negative.

## 2018-11-02 IMAGING — CR DG KNEE 1-2V*R*
2 series · 2 of 2 positions shown · non-contrast
Comparison: No recent.

CLINICAL DATA: Fall.  Right knee pain.

EXAM:
RIGHT KNEE - 1-2 VIEW

[x knee ap right (1 of 2)]
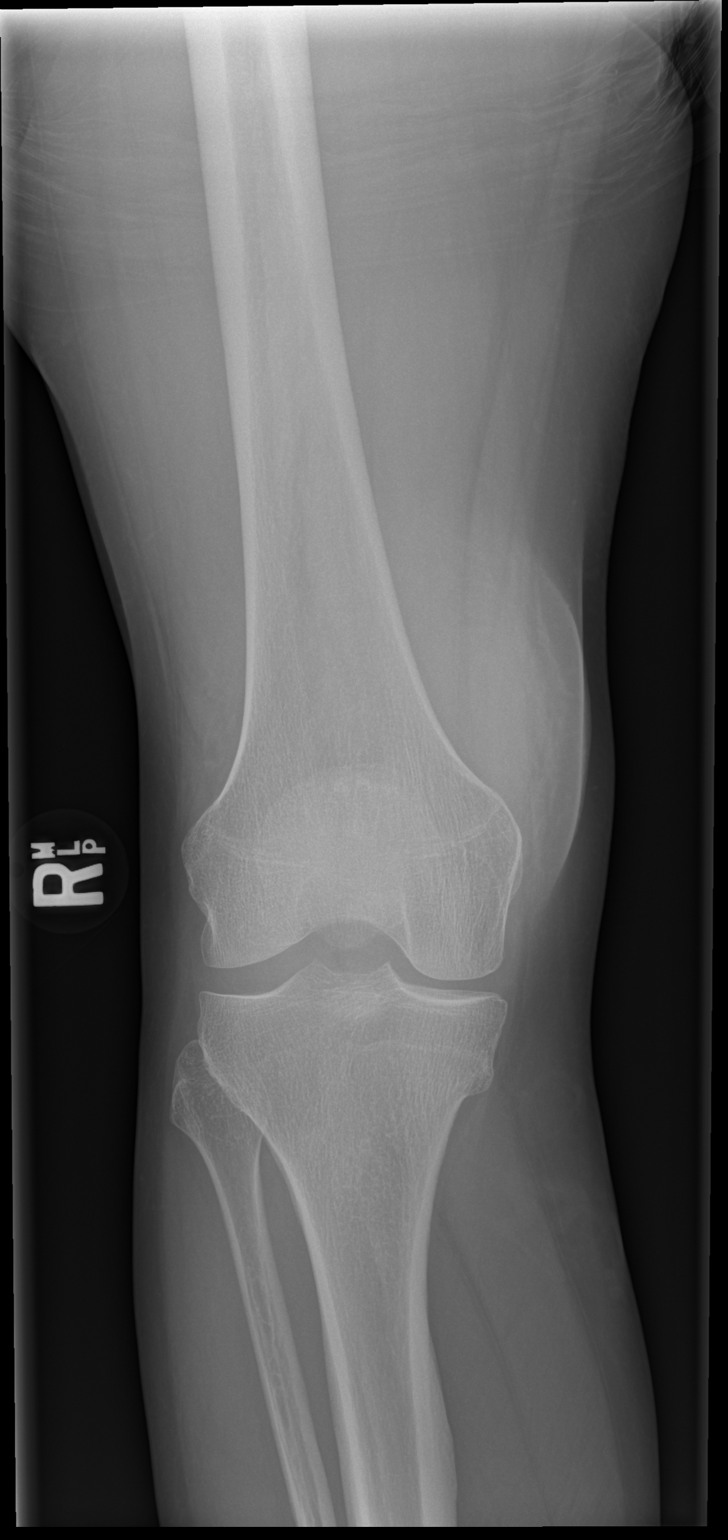

[x knee ap right (2 of 2)]
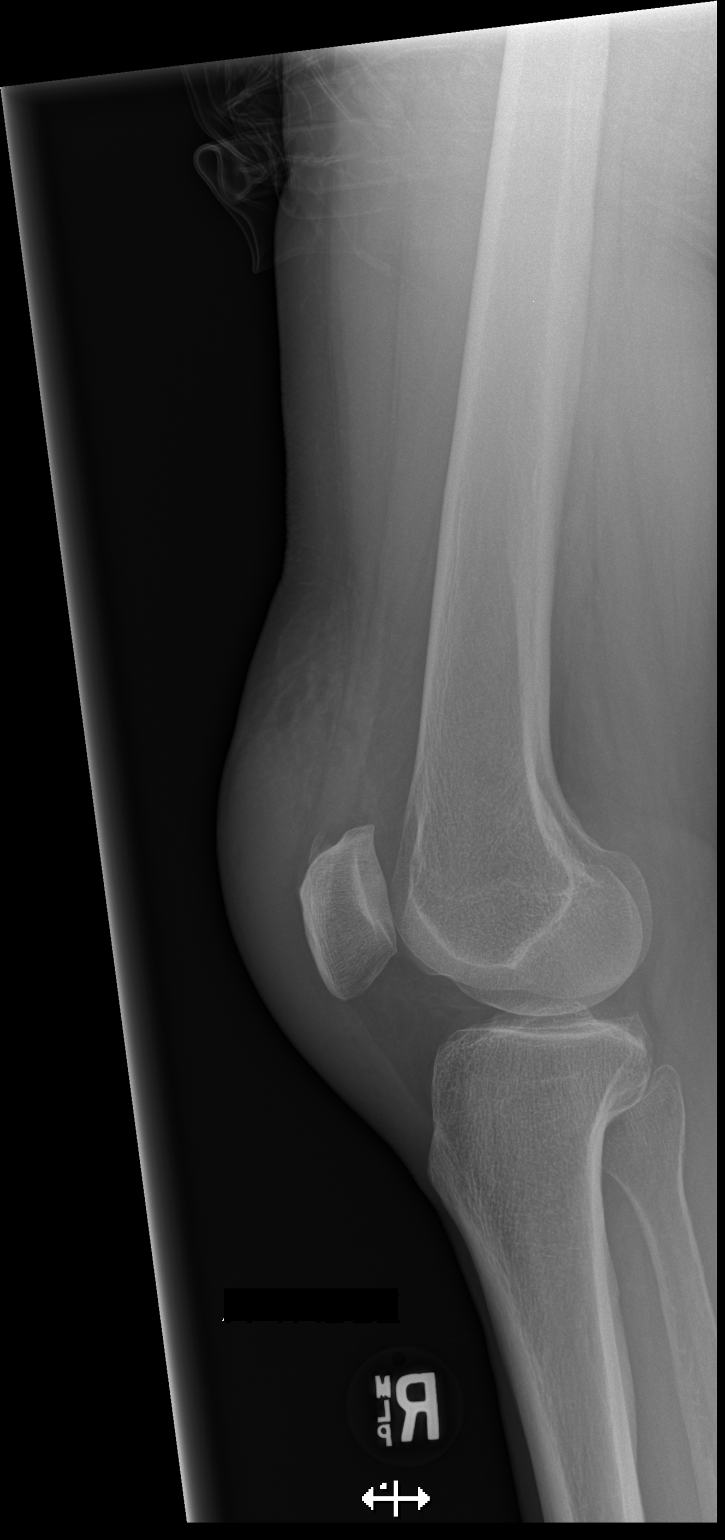

[2 of 2 positions shown; findings below may reference images not displayed]

FINDINGS: Severe prepatellar soft tissue swelling. No acute bony or joint
abnormality identified. No evidence of fracture or dislocation.
Patellofemoral and femoral tibial degenerative change.
IMPRESSION: Severe prepatellar soft tissue swelling. Patellofemoral and femoral
tibial degenerative change. No acute bony abnormality identified.

## 2019-05-19 ENCOUNTER — Other Ambulatory Visit: Payer: Self-pay

## 2019-05-19 DIAGNOSIS — Z20822 Contact with and (suspected) exposure to covid-19: Secondary | ICD-10-CM

## 2019-05-21 LAB — NOVEL CORONAVIRUS, NAA: SARS-CoV-2, NAA: NOT DETECTED

## 2020-04-28 ENCOUNTER — Telehealth: Payer: Self-pay | Admitting: Physician Assistant

## 2020-04-28 NOTE — Telephone Encounter (Signed)
Called to Discuss with patient about Covid symptoms and the use of the monoclonal antibody infusion for those with mild to moderate Covid symptoms and at a high risk of hospitalization.     Pt appears to qualify for this infusion due to co-morbid conditions and/or a member of an at-risk group in accordance with the FDA Emergency Use Authorization.   Pt qualifies with high SVI. However, she states that she has chronic hives and has had several allergic reactions to mediations in the past. I suggested that she discuss MAB with her PCP, she is waiting on a call back. Her husband is also COVID positive. I have sent information about MAB to her MyChart message. She will discuss with PCP and her husband and all back if she wants to schedule.
